# Patient Record
Sex: Male | Born: 1985 | Race: Black or African American | Hispanic: No | Smoking: Current every day smoker
Health system: Southern US, Community
[De-identification: ages and names within clinical notes are randomized; demographics above are authoritative.]

## PROBLEM LIST (undated history)

## (undated) DIAGNOSIS — F419 Anxiety disorder, unspecified: Secondary | ICD-10-CM

## (undated) DIAGNOSIS — F32A Depression, unspecified: Secondary | ICD-10-CM

## (undated) HISTORY — PX: FEMUR FRACTURE SURGERY: SHX633

---

## 2019-12-30 ENCOUNTER — Encounter (HOSPITAL_COMMUNITY): Payer: Self-pay | Admitting: Emergency Medicine

## 2019-12-30 ENCOUNTER — Ambulatory Visit (HOSPITAL_COMMUNITY)
Admission: EM | Admit: 2019-12-30 | Discharge: 2019-12-30 | Disposition: A | Discharge status: Home / Self Care | Payer: No Typology Code available for payment source | Attending: Family Medicine | Admitting: Family Medicine

## 2019-12-30 ENCOUNTER — Other Ambulatory Visit: Payer: Self-pay

## 2019-12-30 DIAGNOSIS — R369 Urethral discharge, unspecified: Secondary | ICD-10-CM | POA: Insufficient documentation

## 2019-12-30 DIAGNOSIS — Z113 Encounter for screening for infections with a predominantly sexual mode of transmission: Secondary | ICD-10-CM | POA: Diagnosis present

## 2019-12-30 MED ORDER — CEFTRIAXONE SODIUM 500 MG IJ SOLR
INTRAMUSCULAR | Status: AC
  Filled 2019-12-30: qty 500

## 2019-12-30 MED ORDER — CEFTRIAXONE SODIUM 500 MG IJ SOLR
500.0000 mg | Freq: Once | INTRAMUSCULAR | Status: AC
  Administered 2019-12-30: 500 mg via INTRAMUSCULAR

## 2019-12-30 MED ORDER — DOXYCYCLINE HYCLATE 100 MG PO CAPS: 100.0000 mg | ORAL_CAPSULE | Freq: Two times a day (BID) | ORAL | 0 refills | Status: AC

## 2019-12-30 NOTE — ED Triage Notes (Signed)
Pt here for STD screening; pt sts penile discharge

## 2019-12-30 NOTE — ED Provider Notes (Signed)
[place]    CSN: [id number] Arrival date & time: 12/30/19  3748      History   Chief Complaint Chief Complaint  Patient presents with  . Exposure to STD    HPI Alexander Rose is a 34 y.o. male.   HPI  Patient presents for treatment of STD. Patient engaged in unprotected sex with a new partner approximately 1 week ago. Days following this encounter, patient developed dysuria, penile discharge-draining white and yellow discharge. He contacted his last sexual partner and she is without symptoms. He denies abdominal pain, nausea, vomiting, back pain,or changes in urine odor. He is in a relationship with his pregnant girlfriend, however,endorses no sexual contact with her since prior sexual partner.  History reviewed. No pertinent past medical history.  There are no problems to display for this patient.  History reviewed. No pertinent surgical history.  Home Medications    Prior to Admission medications   Not on File    Family History Family History  Problem Relation Age of Onset  . Healthy Mother   . Healthy Father     Social History Social History   Tobacco Use  . Smoking status: Never Smoker  . Smokeless tobacco: Never Used  Substance Use Topics  . Alcohol use: Never  . Drug use: Never     Allergies   Patient has no known allergies.   Review of Systems Review of Systems Pertinent negatives listed in HPI  Physical Exam Triage Vital Signs ED Triage Vitals [12/30/19 1031]  Enc Vitals Group     BP (!) 146/94     Pulse Rate 71     Resp 18     Temp 98 F (36.7 C)     Temp Source Oral     SpO2 96 %     Weight      Height      Head Circumference      Peak Flow      Pain Score 0     Pain Loc      Pain Edu?      Excl. in [place]?    No data found.  Updated Vital Signs BP (!) 146/94 (BP Location: Right Arm)   Pulse 71   Temp 98 F (36.7 C) (Oral)   Resp 18   SpO2 96%   Visual Acuity Right Eye Distance:   Left Eye Distance:     Bilateral Distance:    Right Eye Near:   Left Eye Near:    Bilateral Near:     Physical Exam General appearance: alert, well developed, well nourished, cooperative and in no distress Head: Normocephalic, without obvious abnormality, atraumatic Respiratory: Respirations even and unlabored, normal respiratory rate Heart: rate and rhythm normal.  Extremities: No gross deformities Skin: Skin color, texture, turgor normal. No rashes seen  Psych: Appropriate mood and affect. Neurologic: Alert, oriented to person, place, and time, thought content appropriate. Genitourinary: Patient self collected urethral specimen  UC Treatments / Results  Labs (all labs ordered are listed, but only abnormal results are displayed) Labs Reviewed - No data to display  EKG   Radiology No results found.  Procedures Procedures (including critical care time)  Medications Ordered in UC Medications - No data to display  Initial Impression / Assessment and Plan / UC Course  I have reviewed the triage vital signs and the nursing notes.  Pertinent labs & imaging results that were available during my care of the patient were reviewed by me and  considered in my medical decision making (see chart for details).   Encounter for STD screening and treatment following a recent exposure by partner that is positive for chlamydia. Treating for both Gonorrhea and Chlamydia, Rocephin 500 mg IM and Doxycyline 100 mg BID x 7 days prescribed. Encouraged to advise any other recent sexual partners to be tested. Avoid sexual contact for a minimal of  Seven days following treatment and or until penile discharge resolves. Patient declined HIV and RPR testing today. Final Clinical Impressions(s) / UC Diagnoses   Final diagnoses:  Penile discharge  Screen for STD (sexually transmitted disease)     Discharge Instructions     Start Doxycyline 100 mg twice daily for 7 days. Complete entire course to clear infection. If  symptoms worsen or do not completely resolve, return for follow-up here at Urgent Care.     ED Prescriptions    Medication Sig Dispense Auth. Provider   doxycycline (VIBRAMYCIN) 100 MG capsule Take 1 capsule (100 mg total) by mouth 2 (two) times daily for 7 days. 14 capsule Bing Neighbors, FNP     PDMP not reviewed this encounter.   Bing Neighbors, FNP 01/01/20 [phone]

## 2019-12-30 NOTE — Discharge Instructions (Signed)
Start Doxycyline 100 mg twice daily for 7 days. Complete entire course to clear infection. If symptoms worsen or do not completely resolve, return for follow-up here at Urgent Care.

## 2020-01-01 ENCOUNTER — Telehealth (HOSPITAL_COMMUNITY): Payer: Self-pay | Admitting: Emergency Medicine

## 2020-01-01 LAB — CYTOLOGY, (ORAL, ANAL, URETHRAL) ANCILLARY ONLY
Chlamydia: NEGATIVE
Neisseria Gonorrhea: POSITIVE — AB

## 2020-01-01 NOTE — Telephone Encounter (Signed)
Test for gonorrhea was positive. This was treated at the urgent care visit with IM rocephin 500mg. Please refrain from sexual intercourse for 7 days after treatment to give the medicine time to work. Sexual partners need to be notified and tested/treated. Condoms may reduce risk of reinfection. Recheck or followup with PCP for further evaluation if symptoms are not improving. [facility] notified.   Attempted to reach patient. No answer at this time. Voicemail left.     

## 2020-01-05 ENCOUNTER — Telehealth (HOSPITAL_COMMUNITY): Payer: Self-pay | Admitting: Emergency Medicine

## 2020-01-05 NOTE — Telephone Encounter (Signed)
Patient contacted by phone and made aware of  cytology  results. Pt verbalized understanding and had all questions answered.    

## 2020-04-01 DIAGNOSIS — F32A Anxiety disorder, unspecified: Secondary | ICD-10-CM | POA: Insufficient documentation

## 2020-06-06 ENCOUNTER — Ambulatory Visit (HOSPITAL_COMMUNITY)
Admission: EM | Admit: 2020-06-06 | Discharge: 2020-06-06 | Disposition: A | Discharge status: Home / Self Care | Payer: No Typology Code available for payment source | Attending: Urgent Care | Admitting: Urgent Care

## 2020-06-06 ENCOUNTER — Other Ambulatory Visit: Payer: Self-pay

## 2020-06-06 ENCOUNTER — Encounter (HOSPITAL_COMMUNITY): Payer: Self-pay | Admitting: Emergency Medicine

## 2020-06-06 DIAGNOSIS — Z566 Other physical and mental strain related to work: Secondary | ICD-10-CM

## 2020-06-06 DIAGNOSIS — R0789 Other chest pain: Secondary | ICD-10-CM | POA: Diagnosis not present

## 2020-06-06 DIAGNOSIS — F419 Anxiety disorder, unspecified: Secondary | ICD-10-CM

## 2020-06-06 DIAGNOSIS — F4310 Post-traumatic stress disorder, unspecified: Secondary | ICD-10-CM

## 2020-06-06 HISTORY — DX: Anxiety disorder, unspecified: F41.9

## 2020-06-06 HISTORY — DX: Depression, unspecified: F32.A

## 2020-06-06 MED ORDER — SERTRALINE HCL 50 MG PO TABS: 50.0000 mg | ORAL_TABLET | Freq: Every day | ORAL | 0 refills | Status: DC

## 2020-06-06 NOTE — ED Triage Notes (Signed)
Pt here for increased anxiety with fullness in chest x 3 days; pt sts increased stress and hx of similar in past with increased stress; pt denies SI/HI; pt sts increased stress with his job
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
## 2020-06-24 ENCOUNTER — Other Ambulatory Visit: Payer: Self-pay

## 2020-06-24 ENCOUNTER — Encounter (HOSPITAL_COMMUNITY): Payer: Self-pay

## 2020-06-24 ENCOUNTER — Ambulatory Visit (HOSPITAL_COMMUNITY)
Admission: EM | Admit: 2020-06-24 | Discharge: 2020-06-24 | Disposition: A | Discharge status: Home / Self Care | Payer: No Typology Code available for payment source | Attending: Family Medicine | Admitting: Family Medicine

## 2020-06-24 DIAGNOSIS — Z0289 Encounter for other administrative examinations: Secondary | ICD-10-CM

## 2020-06-24 NOTE — ED Provider Notes (Signed)
[facility]    CSN: [id number] Arrival date & time: 06/24/20  0809      History   Chief Complaint Chief Complaint  Patient presents with  . work note    HPI Alexander Rose is a 34 y.o. male.   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  He was cooking pork.  He states there was wrapped work on his hands and the smell of pork as it was cooking. Patient states that he has been Muslim for 17 years and has not eaten pork for that period of time He does not recall any pork allergy prior to becoming Muslim He is not allergic to any other meats.  Is not allergic to cats When he has been seen in the office before he has stated no allergies He does indicate that it is a Muslim he objects to pork as a "forbidden food" he has spoken to his workplace that he wishes to avoid pork as a religious preference.  They still place him in areas where he is exposed to pork.  He wishes a note from the doctor stating he is allergic to pork so he will not have to work with it anymore.  I explained to him that vomiting when he smells cooking or handling it with his hands is not enough of any exposure to cause an allergic reaction, in my experience it would need to be ingested.  In any event I think this is his aversion to pork and not a true allergic reaction.  He states that he had a vomiting reaction in April and went to the emergency room.  He states he went to [facility] emergency room.  I cannot find this record.  Past Medical History:  Diagnosis Date  . Anxiety   . Depression     There are no problems to display for this patient.   History reviewed. No pertinent surgical history.     Home Medications    Prior to Admission medications   Medication Sig Start Date End Date Taking?  Authorizing Provider  sertraline (ZOLOFT) 50 MG tablet Take 1 tablet (50 mg total) by mouth daily. 06/06/20  Yes Wallis Bamberg, PA-C    Family History Family History  Problem Relation Age of Onset  . Healthy Mother   . Healthy Father     Social History Social History   Tobacco Use  . Smoking status: Never Smoker  . Smokeless tobacco: Never Used  Substance Use Topics  . Alcohol use: Never  . Drug use: Never     Allergies   Patient has no known allergies.   Review of Systems Review of Systems See HPI Physical Exam Triage Vital Signs ED Triage Vitals [06/24/20 0843]  Enc Vitals Group     BP 122/68     Pulse Rate 88     Resp 16     Temp 98.2 F (36.8 C)     Temp Source Oral     SpO2      Weight      Height      Head Circumference      Peak Flow      Pain Score 0     Pain Loc      Pain Edu?  Excl. in [place]?    No data found.  Updated Vital Signs BP 122/68 (BP Location: Right Arm)   Pulse 88   Temp 98.2 F (36.8 C) (Oral)   Resp 16  :     Physical Exam Constitutional:      General: He is not in acute distress.    Appearance: He is well-developed.     Comments: Healthy appearance  HENT:     Head: Normocephalic and atraumatic.     Mouth/Throat:     Comments: Mask in place Eyes:     Conjunctiva/sclera: Conjunctivae normal.     Pupils: Pupils are equal, round, and reactive to light.  Cardiovascular:     Rate and Rhythm: Normal rate.  Pulmonary:     Effort: Pulmonary effort is normal. No respiratory distress.  Abdominal:     General: There is no distension.     Palpations: Abdomen is soft.  Musculoskeletal:        General: Normal range of motion.     Cervical back: Normal range of motion.  Skin:    General: Skin is warm and dry.  Neurological:     Mental Status: He is alert.  Psychiatric:        Mood and Affect: Mood normal.        Behavior: Behavior normal.     Comments: Pleasant, pressured      UC Treatments / Results  Labs (all  labs ordered are listed, but only abnormal results are displayed) Labs Reviewed - No data to display  EKG   Radiology No results found.  Procedures Procedures (including critical care time)  Medications Ordered in UC Medications - No data to display  Initial Impression / Assessment and Plan / UC Course  I have reviewed the triage vital signs and the nursing notes.  Pertinent labs & imaging results that were available during my care of the patient were reviewed by me and considered in my medical decision making (see chart for details).     I reviewed the patient that I do not have any evidence that he is allergic.  I cannot write a note for his employer I recommended that he try to reason with his employer regarding his religious preference I did offer allergy testing if he wishes to pursue this avenue Final Clinical Impressions(s) / UC Diagnoses   Final diagnoses:  Encounter to obtain excuse from work     Discharge Instructions     We are unable to excuse from work I can refer you for allergy evaluation   ED Prescriptions    None     PDMP not reviewed this encounter.   Eustace Moore, MD 06/24/20 1039

## 2020-06-24 NOTE — ED Triage Notes (Signed)
Pt presents to UC requesting documentation of pork allergy to provide to work place. No acute complaints at this time.

## 2020-06-24 NOTE — Discharge Instructions (Signed)
We are unable to excuse from work I can refer you for allergy evaluation

## 2020-07-03 ENCOUNTER — Emergency Department (HOSPITAL_COMMUNITY)
Admission: EM | Admit: 2020-07-03 | Discharge: 2020-07-03 | Discharge status: Against Medical Advice | Payer: No Typology Code available for payment source | Attending: Emergency Medicine | Admitting: Emergency Medicine

## 2020-07-03 ENCOUNTER — Emergency Department (HOSPITAL_COMMUNITY): Payer: No Typology Code available for payment source

## 2020-07-03 ENCOUNTER — Other Ambulatory Visit: Payer: Self-pay

## 2020-07-03 DIAGNOSIS — S50312A Abrasion of left elbow, initial encounter: Secondary | ICD-10-CM | POA: Insufficient documentation

## 2020-07-03 DIAGNOSIS — Y929 Unspecified place or not applicable: Secondary | ICD-10-CM | POA: Diagnosis not present

## 2020-07-03 DIAGNOSIS — R0789 Other chest pain: Secondary | ICD-10-CM | POA: Insufficient documentation

## 2020-07-03 DIAGNOSIS — S50311A Abrasion of right elbow, initial encounter: Secondary | ICD-10-CM | POA: Diagnosis not present

## 2020-07-03 DIAGNOSIS — Y999 Unspecified external cause status: Secondary | ICD-10-CM | POA: Diagnosis not present

## 2020-07-03 DIAGNOSIS — Y939 Activity, unspecified: Secondary | ICD-10-CM | POA: Diagnosis not present

## 2020-07-03 DIAGNOSIS — S80211A Abrasion, right knee, initial encounter: Secondary | ICD-10-CM | POA: Diagnosis not present

## 2020-07-03 DIAGNOSIS — S8991XA Unspecified injury of right lower leg, initial encounter: Secondary | ICD-10-CM | POA: Diagnosis present

## 2020-07-03 DIAGNOSIS — R079 Chest pain, unspecified: Secondary | ICD-10-CM

## 2020-07-03 DIAGNOSIS — M25532 Pain in left wrist: Secondary | ICD-10-CM | POA: Diagnosis not present

## 2020-07-03 DIAGNOSIS — T148XXA Other injury of unspecified body region, initial encounter: Secondary | ICD-10-CM

## 2020-07-03 DIAGNOSIS — M25531 Pain in right wrist: Secondary | ICD-10-CM | POA: Diagnosis not present

## 2020-07-03 DIAGNOSIS — S80212A Abrasion, left knee, initial encounter: Secondary | ICD-10-CM | POA: Diagnosis not present

## 2020-07-03 NOTE — ED Notes (Signed)
Pt returned to room at this time after changing his mind and wanting to be seen again at this time. Apolinar Junes PA at bedside.
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
## 2020-07-03 NOTE — ED Notes (Signed)
PT speaking to [place] PA at this time in hall stating he wants to leave. PT ambulatory out of dept without signing AMA form

## 2020-07-03 NOTE — ED Notes (Signed)
Pt refused EKG.

## 2020-12-01 ENCOUNTER — Encounter (HOSPITAL_COMMUNITY): Payer: Self-pay | Admitting: Emergency Medicine

## 2020-12-01 ENCOUNTER — Emergency Department (HOSPITAL_COMMUNITY)
Admission: EM | Admit: 2020-12-01 | Discharge: 2020-12-02 | Disposition: A | Discharge status: Home / Self Care | Payer: Worker's Compensation | Attending: Emergency Medicine | Admitting: Emergency Medicine

## 2020-12-01 ENCOUNTER — Other Ambulatory Visit: Payer: Self-pay

## 2020-12-01 DIAGNOSIS — T22032A Burn of unspecified degree of left upper arm, initial encounter: Secondary | ICD-10-CM | POA: Insufficient documentation

## 2020-12-01 DIAGNOSIS — T24012A Burn of unspecified degree of left thigh, initial encounter: Secondary | ICD-10-CM | POA: Diagnosis not present

## 2020-12-01 DIAGNOSIS — X102XXA Contact with fats and cooking oils, initial encounter: Secondary | ICD-10-CM | POA: Insufficient documentation

## 2020-12-01 DIAGNOSIS — T24011A Burn of unspecified degree of right thigh, initial encounter: Secondary | ICD-10-CM | POA: Insufficient documentation

## 2020-12-01 DIAGNOSIS — Y9289 Other specified places as the place of occurrence of the external cause: Secondary | ICD-10-CM | POA: Diagnosis not present

## 2020-12-01 DIAGNOSIS — Z5321 Procedure and treatment not carried out due to patient leaving prior to being seen by health care provider: Secondary | ICD-10-CM | POA: Insufficient documentation
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
## 2020-12-02 ENCOUNTER — Ambulatory Visit (HOSPITAL_COMMUNITY)
Admission: EM | Admit: 2020-12-02 | Discharge: 2020-12-02 | Disposition: A | Discharge status: Home / Self Care | Payer: PRIVATE HEALTH INSURANCE | Attending: Family Medicine | Admitting: Family Medicine

## 2020-12-02 ENCOUNTER — Encounter (HOSPITAL_COMMUNITY): Payer: Self-pay

## 2020-12-02 DIAGNOSIS — T300 Burn of unspecified body region, unspecified degree: Secondary | ICD-10-CM

## 2020-12-02 MED ORDER — TRAMADOL HCL 50 MG PO TABS: 50.0000 mg | ORAL_TABLET | Freq: Four times a day (QID) | ORAL | 0 refills | Status: DC | PRN

## 2020-12-02 MED ORDER — ERYTHROMYCIN 5 MG/GM OP OINT: TOPICAL_OINTMENT | OPHTHALMIC | 0 refills | Status: DC

## 2020-12-02 MED ORDER — SILVER SULFADIAZINE 1 % EX CREA: 1.0000 "application " | TOPICAL_CREAM | Freq: Two times a day (BID) | CUTANEOUS | 1 refills | Status: DC

## 2020-12-02 NOTE — ED Notes (Signed)
Pt states that he does not want to wait due to having kids at home. Pt seen walking out ED entrance.
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
## 2020-12-06 ENCOUNTER — Ambulatory Visit (HOSPITAL_COMMUNITY)
Admission: EM | Admit: 2020-12-06 | Discharge: 2020-12-06 | Disposition: A | Discharge status: Home / Self Care | Payer: PRIVATE HEALTH INSURANCE | Attending: Family Medicine | Admitting: Family Medicine

## 2020-12-06 ENCOUNTER — Other Ambulatory Visit: Payer: Self-pay

## 2020-12-06 ENCOUNTER — Encounter (HOSPITAL_COMMUNITY): Payer: Self-pay | Admitting: Emergency Medicine

## 2020-12-06 DIAGNOSIS — R030 Elevated blood-pressure reading, without diagnosis of hypertension: Secondary | ICD-10-CM

## 2020-12-06 DIAGNOSIS — T300 Burn of unspecified body region, unspecified degree: Secondary | ICD-10-CM

## 2020-12-06 MED ORDER — TRAMADOL HCL 50 MG PO TABS: 50.0000 mg | ORAL_TABLET | Freq: Four times a day (QID) | ORAL | 0 refills | Status: DC | PRN

## 2020-12-06 MED ORDER — ERYTHROMYCIN 5 MG/GM OP OINT
TOPICAL_OINTMENT | OPHTHALMIC | Status: AC
  Filled 2020-12-06: qty 3.5

## 2020-12-06 MED ORDER — IBUPROFEN 800 MG PO TABS: 800.0000 mg | ORAL_TABLET | Freq: Three times a day (TID) | ORAL | 0 refills | Status: DC | PRN

## 2020-12-06 MED ORDER — ERYTHROMYCIN 5 MG/GM OP OINT: TOPICAL_OINTMENT | Freq: Once | OPHTHALMIC | Status: DC

## 2020-12-06 NOTE — Discharge Instructions (Signed)
Wash daily and apply burn cream Take ibuprofen for moderate pain Take tramadol for more severe pain Avoid heat Watch for infection Follow up with workers comp clinic See a PCP for blood pressure
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
## 2020-12-09 ENCOUNTER — Encounter (HOSPITAL_COMMUNITY): Payer: Self-pay

## 2020-12-09 ENCOUNTER — Other Ambulatory Visit: Payer: Self-pay

## 2020-12-09 ENCOUNTER — Ambulatory Visit (HOSPITAL_COMMUNITY)
Admission: EM | Admit: 2020-12-09 | Discharge: 2020-12-09 | Disposition: A | Discharge status: Home / Self Care | Payer: PRIVATE HEALTH INSURANCE

## 2020-12-09 DIAGNOSIS — Z09 Encounter for follow-up examination after completed treatment for conditions other than malignant neoplasm: Secondary | ICD-10-CM | POA: Diagnosis not present

## 2020-12-09 NOTE — Discharge Instructions (Addendum)
Pt is to follow up with work comp for specialist or further care  Cont to use saline drops as needed for eyes  We will not be able to give any further days off work
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
## 2020-12-16 ENCOUNTER — Telehealth (HOSPITAL_COMMUNITY): Payer: Self-pay | Admitting: Family Medicine
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
## 2021-02-25 ENCOUNTER — Ambulatory Visit (HOSPITAL_COMMUNITY)
Admission: EM | Admit: 2021-02-25 | Discharge: 2021-02-25 | Disposition: A | Discharge status: Home / Self Care | Payer: PRIVATE HEALTH INSURANCE | Attending: Emergency Medicine | Admitting: Emergency Medicine

## 2021-02-25 ENCOUNTER — Other Ambulatory Visit: Payer: Self-pay

## 2021-02-25 DIAGNOSIS — M25512 Pain in left shoulder: Secondary | ICD-10-CM

## 2021-02-25 MED ORDER — NAPROXEN 500 MG PO TABS: 500.0000 mg | ORAL_TABLET | Freq: Two times a day (BID) | ORAL | 0 refills | Status: DC
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
## 2021-02-25 NOTE — ED Provider Notes (Signed)
[facility]    CSN: [id number] Arrival date & time: 02/25/21  1450      History   Chief Complaint Chief Complaint  Patient presents with  . Shoulder Pain    HPI Alexander Rose is a 35 y.o. male.   Alexander Rose presents with complaints of left shoulder pain which started two days ago. No specific known injury but he had been lifting at work prior to onset of pain. Pain with raising his left arm such as to drive and to take his shirt off. Today pain and ROM is slightly improved from yesterday. Hasn't taken any medications for symptoms. No radiation of pain. No numbness tingling or weakness. 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 No previous shoulder injury.     ROS per HPI, negative if not otherwise mentioned.      Past Medical History:  Diagnosis Date  . Anxiety   . Depression     There are no problems to display for this patient.   No past surgical history on file.     Home Medications    Prior to Admission medications   Medication Sig Start Date End Date Taking? Authorizing Provider  naproxen (NAPROSYN) 500 MG tablet Take 1 tablet (500 mg total) by mouth 2 (two) times daily. 02/25/21  Yes Linus Mako B, NP  erythromycin ophthalmic ointment Place a 1/2 inch ribbon of ointment into the lower eyelid. 12/02/20   Particia Nearing, PA-C  ibuprofen (ADVIL) 800 MG tablet Take 1 tablet (800 mg total) by mouth every 8 (eight) hours as needed. 12/06/20   Eustace Moore, MD  sertraline (ZOLOFT) 50 MG tablet Take 1 tablet (50 mg total) by mouth daily. 06/06/20   Wallis Bamberg, PA-C  silver sulfADIAZINE (SILVADENE) 1 % cream Apply 1 application topically 2 (two) times daily. 12/02/20   Particia Nearing, PA-C  traMADol (ULTRAM) 50 MG tablet Take 1 tablet (50 mg total) by mouth every 6 (six) hours as needed. 12/06/20   Eustace Moore, MD    Family History Family History  Problem Relation Age of Onset  .  Healthy Mother   . Healthy Father     Social History Social History   Tobacco Use  . Smoking status: Never Smoker  . Smokeless tobacco: Never Used  Substance Use Topics  . Alcohol use: Never  . Drug use: Never     Allergies   Patient has no known allergies.   Review of Systems Review of Systems   Physical Exam Triage Vital Signs ED Triage Vitals  Enc Vitals Group     BP 02/25/21 1547 (!) 147/92     Pulse Rate 02/25/21 1547 87     Resp 02/25/21 1547 18     Temp 02/25/21 1547 98.8 F (37.1 C)     Temp Source 02/25/21 1547 Oral     SpO2 02/25/21 1547 97 %     Weight --      Height --      Head Circumference --      Peak Flow --      Pain Score 02/25/21 1544 8     Pain Loc --      Pain Edu? --      Excl. in GC? --    No data found.  Updated Vital Signs BP (!) 147/92 (BP Location: Right Arm)   Pulse 87   Temp 98.8 F (37.1 C) (Oral)  Resp 18   SpO2 97%   Visual Acuity Right Eye Distance:   Left Eye Distance:   Bilateral Distance:    Right Eye Near:   Left Eye Near:    Bilateral Near:     Physical Exam Constitutional:      Appearance: He is well-developed.  Cardiovascular:     Rate and Rhythm: Normal rate.  Pulmonary:     Effort: Pulmonary effort is normal.  Musculoskeletal:     Left shoulder: Swelling and tenderness present. No bony tenderness. Decreased range of motion. Normal strength. Normal pulse.     Comments: Left shoulder soft tissue tenderness, supraspinatus (?), to anterior and posterior shoulder with mild swelling noted to left anterior shoulder; pain with overhead ROM as well as external rotation; strength equal bilaterally; gross sensation intact to upper extremities   Skin:    General: Skin is warm and dry.  Neurological:     Mental Status: He is alert and oriented to person, place, and time.      UC Treatments / Results  Labs (all labs ordered are listed, but only abnormal results are displayed) Labs Reviewed - No data to  display  EKG   Radiology No results found.  Procedures Procedures (including critical care time)  Medications Ordered in UC Medications - No data to display  Initial Impression / Assessment and Plan / UC Course  I have reviewed the triage vital signs and the nursing notes.  Pertinent labs & imaging results that were available during my care of the patient were reviewed by me and considered in my medical decision making (see chart for details).     Left shoulder strain, supraspinatus injury? Vs ligamentous injury. nsaids recommended, follow up with ortho and/or sports medicine. Patient verbalized understanding and agreeable to plan.   Final Clinical Impressions(s) / UC Diagnoses   Final diagnoses:  Acute pain of left shoulder     Discharge Instructions     Apply ice at the end of the day after use.  Light and regular activity as tolerated, see provided exercises.  Naproxen twice a day, take with food, to help with pain.  Follow up with sports medicine and/or orthopedics for further evaluation and management.    ED Prescriptions    Medication Sig Dispense Auth. Provider   naproxen (NAPROSYN) 500 MG tablet Take 1 tablet (500 mg total) by mouth 2 (two) times daily. 30 tablet Georgetta Haber, NP     PDMP not reviewed this encounter.   Georgetta Haber, NP 02/25/21 1626

## 2021-02-25 NOTE — Discharge Instructions (Signed)
Apply ice at the end of the day after use.  Light and regular activity as tolerated, see provided exercises.  Naproxen twice a day, take with food, to help with pain.  Follow up with sports medicine and/or orthopedics for further evaluation and management.

## 2021-02-27 ENCOUNTER — Other Ambulatory Visit: Payer: Self-pay

## 2021-02-27 ENCOUNTER — Emergency Department (HOSPITAL_COMMUNITY): Payer: No Typology Code available for payment source

## 2021-02-27 ENCOUNTER — Emergency Department (HOSPITAL_COMMUNITY)
Admission: EM | Admit: 2021-02-27 | Discharge: 2021-02-27 | Disposition: A | Discharge status: Home / Self Care | Payer: No Typology Code available for payment source | Attending: Emergency Medicine | Admitting: Emergency Medicine

## 2021-02-27 DIAGNOSIS — Y92410 Unspecified street and highway as the place of occurrence of the external cause: Secondary | ICD-10-CM | POA: Diagnosis not present

## 2021-02-27 DIAGNOSIS — S40012A Contusion of left shoulder, initial encounter: Secondary | ICD-10-CM | POA: Insufficient documentation

## 2021-02-27 DIAGNOSIS — S4992XA Unspecified injury of left shoulder and upper arm, initial encounter: Secondary | ICD-10-CM | POA: Diagnosis present
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
## 2021-02-27 NOTE — Discharge Instructions (Signed)
Return if any problems.  Schedule to see the Orthopaedist for evaluation °
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
## 2021-05-23 ENCOUNTER — Ambulatory Visit (HOSPITAL_COMMUNITY)
Admission: EM | Admit: 2021-05-23 | Discharge: 2021-05-23 | Disposition: A | Discharge status: Home / Self Care | Payer: PRIVATE HEALTH INSURANCE | Attending: Emergency Medicine | Admitting: Emergency Medicine

## 2021-05-23 ENCOUNTER — Encounter (HOSPITAL_COMMUNITY): Payer: Self-pay

## 2021-05-23 ENCOUNTER — Other Ambulatory Visit: Payer: Self-pay

## 2021-05-23 DIAGNOSIS — Z202 Contact with and (suspected) exposure to infections with a predominantly sexual mode of transmission: Secondary | ICD-10-CM | POA: Diagnosis not present

## 2021-05-23 DIAGNOSIS — A599 Trichomoniasis, unspecified: Secondary | ICD-10-CM | POA: Diagnosis not present

## 2021-05-23 MED ORDER — METRONIDAZOLE 500 MG PO TABS: 2000.0000 mg | ORAL_TABLET | Freq: Once | ORAL | 0 refills | Status: AC

## 2021-05-23 NOTE — ED Provider Notes (Signed)
[facility]  ____________________________________________  Time seen: Approximately 1:08 PM  I have reviewed the triage vital signs and the nursing notes.   HISTORY  Chief Complaint Exposure to STD    HPI Alexander Rose is a 35 y.o. male who presents the urgent care concerned that he has been exposed to trichomoniasis.  Patient's wife was diagnosed with trichomoniasis and he wanted testing.  Patient has no abdominal pain, dysuria, polyuria, hematuria.  No genital lesions.  No testicular pain.  Patient is asymptomatic at this time.       Past Medical History:  Diagnosis Date   Anxiety    Depression     There are no problems to display for this patient.   History reviewed. No pertinent surgical history.  Prior to Admission medications   Medication Sig Start Date End Date Taking? Authorizing Provider  metroNIDAZOLE (FLAGYL) 500 MG tablet Take 4 tablets (2,000 mg total) by mouth once for 1 dose. 05/23/21 05/23/21 Yes Orlandus Borowski, Delorise Royals, PA-C  naproxen (NAPROSYN) 500 MG tablet Take 1 tablet (500 mg total) by mouth 2 (two) times daily. 02/25/21   Georgetta Haber, NP  sertraline (ZOLOFT) 50 MG tablet Take 1 tablet (50 mg total) by mouth daily. 06/06/20 02/27/21  Wallis Bamberg, PA-C    Allergies Patient has no known allergies.  Family History  Problem Relation Age of Onset   Healthy Mother    Healthy Father     Social History Social History   Tobacco Use   Smoking status: Every Day    Packs/day: 1.00    Years: 12.00    Pack years: 12.00    Types: Cigarettes   Smokeless tobacco: Never  Substance Use Topics   Alcohol use: Never   Drug use: Never     Review of Systems  Constitutional: No fever/chills Eyes: No visual changes. No discharge ENT: No upper respiratory complaints. Cardiovascular: no chest pain. Respiratory: no cough. No SOB. Gastrointestinal: No abdominal pain.  No nausea, no vomiting.  No diarrhea.  No constipation. Genitourinary:  Negative for dysuria. No hematuria.  Positive for exposure to trichomoniasis Musculoskeletal: Negative for musculoskeletal pain. Skin: Negative for rash, abrasions, lacerations, ecchymosis. Neurological: Negative for headaches, focal weakness or numbness.  10 System ROS otherwise negative.  ____________________________________________   PHYSICAL EXAM:  VITAL SIGNS: ED Triage Vitals  Enc Vitals Group     BP 05/23/21 1219 (!) 155/90     Pulse Rate 05/23/21 1219 65     Resp 05/23/21 1219 18     Temp 05/23/21 1219 98.2 F (36.8 C)     Temp Source 05/23/21 1219 Oral     SpO2 05/23/21 1219 96 %     Weight --      Height --      Head Circumference --      Peak Flow --      Pain Score 05/23/21 1216 0     Pain Loc --      Pain Edu? --      Excl. in GC? --      Constitutional: Alert and oriented. Well appearing and in no acute distress. Eyes: Conjunctivae are normal. PERRL. EOMI. Head: Atraumatic. ENT:      Ears:       Nose: No congestion/rhinnorhea.      Mouth/Throat: Mucous membranes are moist.  Neck: No stridor.    Cardiovascular: Normal rate, regular rhythm. Normal S1 and S2.  Good peripheral circulation. Respiratory: Normal respiratory effort without tachypnea or  retractions. Lungs CTAB. Good air entry to the bases with no decreased or absent breath sounds. Gastrointestinal: Bowel sounds 4 quadrants. Soft and nontender to palpation. No guarding or rigidity. No palpable masses. No distention. No CVA tenderness. Genitourinary: Patient defers exam at this time Musculoskeletal: Full range of motion to all extremities. No gross deformities appreciated. Neurologic:  Normal speech and language. No gross focal neurologic deficits are appreciated.  Skin:  Skin is warm, dry and intact. No rash noted. Psychiatric: Mood and affect are normal. Speech and behavior are normal. Patient exhibits appropriate insight and judgement.   ____________________________________________    LABS (all labs ordered are listed, but only abnormal results are displayed)  Labs Reviewed - No data to display ____________________________________________  EKG   ____________________________________________  RADIOLOGY   No results found.  ____________________________________________    PROCEDURES  Procedure(s) performed:    Procedures    Medications - No data to display   ____________________________________________   INITIAL IMPRESSION / ASSESSMENT AND PLAN / ED COURSE  Pertinent labs & imaging results that were available during my care of the patient were reviewed by me and considered in my medical decision making (see chart for details).  Review of the [place] CSRS was performed in accordance of the [facility] prior to dispensing any controlled drugs.           Patient's diagnosis is consistent with exposure to trichomoniasis.  Patient presented to the urgent care after being exposed to trichomoniasis.  Patient has a positive/known exposure but is asymptomatic at this time.  Discussed there is no approved testing for males for trichomoniasis but that we treat empirically.  Discussed symptoms of other STDs and he states that he has no concern for gonorrhea or chlamydia.  He does not wish to pursue testing at this time which I feel is reasonable.  Patient develops symptoms he will return for evaluation.  At this time we will treat empirically with metronidazole for trichomoniasis..  Follow-up primary care as needed.. Patient is given ED precautions to return to the ED for any worsening or new symptoms.     ____________________________________________  FINAL CLINICAL IMPRESSION(S) / DIAGNOSES  Final diagnoses:  STD exposure  Trichomoniasis      NEW MEDICATIONS STARTED DURING THIS VISIT:  ED Discharge Orders          Ordered    metroNIDAZOLE (FLAGYL) 500 MG tablet   Once        05/23/21 1312                This chart was dictated using voice  recognition software/Dragon. Despite best efforts to proofread, errors can occur which can change the meaning. Any change was purely unintentional.    Racheal Patches, PA-C 05/23/21 1313
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
## 2021-09-10 DIAGNOSIS — Z419 Encounter for procedure for purposes other than remedying health state, unspecified: Secondary | ICD-10-CM | POA: Diagnosis not present

## 2021-10-11 DIAGNOSIS — Z419 Encounter for procedure for purposes other than remedying health state, unspecified: Secondary | ICD-10-CM | POA: Diagnosis not present

## 2021-11-10 DIAGNOSIS — Z419 Encounter for procedure for purposes other than remedying health state, unspecified: Secondary | ICD-10-CM | POA: Diagnosis not present

## 2021-12-11 DIAGNOSIS — Z419 Encounter for procedure for purposes other than remedying health state, unspecified: Secondary | ICD-10-CM | POA: Diagnosis not present

## 2022-01-11 DIAGNOSIS — Z419 Encounter for procedure for purposes other than remedying health state, unspecified: Secondary | ICD-10-CM | POA: Diagnosis not present

## 2022-02-08 DIAGNOSIS — Z419 Encounter for procedure for purposes other than remedying health state, unspecified: Secondary | ICD-10-CM | POA: Diagnosis not present

## 2022-03-11 DIAGNOSIS — Z419 Encounter for procedure for purposes other than remedying health state, unspecified: Secondary | ICD-10-CM | POA: Diagnosis not present

## 2022-04-10 DIAGNOSIS — Z419 Encounter for procedure for purposes other than remedying health state, unspecified: Secondary | ICD-10-CM | POA: Diagnosis not present

## 2022-04-17 ENCOUNTER — Ambulatory Visit (HOSPITAL_COMMUNITY)
Admission: EM | Admit: 2022-04-17 | Discharge: 2022-04-17 | Disposition: A | Discharge status: Home / Self Care | Payer: Medicaid Other | Attending: Family Medicine | Admitting: Family Medicine

## 2022-04-17 ENCOUNTER — Encounter (HOSPITAL_COMMUNITY): Payer: Self-pay | Admitting: Emergency Medicine

## 2022-04-17 ENCOUNTER — Ambulatory Visit (INDEPENDENT_AMBULATORY_CARE_PROVIDER_SITE_OTHER): Payer: Medicaid Other

## 2022-04-17 DIAGNOSIS — R079 Chest pain, unspecified: Secondary | ICD-10-CM

## 2022-04-17 DIAGNOSIS — J4521 Mild intermittent asthma with (acute) exacerbation: Secondary | ICD-10-CM

## 2022-04-17 DIAGNOSIS — R030 Elevated blood-pressure reading, without diagnosis of hypertension: Secondary | ICD-10-CM

## 2022-04-17 DIAGNOSIS — M940 Chondrocostal junction syndrome [Tietze]: Secondary | ICD-10-CM

## 2022-04-17 MED ORDER — DICLOFENAC SODIUM 75 MG PO TBEC: 75.0000 mg | DELAYED_RELEASE_TABLET | Freq: Two times a day (BID) | ORAL | 0 refills | Status: DC

## 2022-04-17 MED ORDER — PREDNISONE 20 MG PO TABS: 40.0000 mg | ORAL_TABLET | Freq: Every day | ORAL | 0 refills | Status: DC

## 2022-04-17 MED ORDER — ALBUTEROL SULFATE HFA 108 (90 BASE) MCG/ACT IN AERS: 1.0000 | INHALATION_SPRAY | Freq: Four times a day (QID) | RESPIRATORY_TRACT | 1 refills | Status: DC | PRN

## 2022-04-17 NOTE — ED Triage Notes (Signed)
Pt c/o central chest pain for 2 days. Reports at work last night when lifting or palpating chest pain was worse. Pt reports after work went home to lay down and hurt to turn over. Reports pain in chest was worse with trying to pull up pants. ? ?Pt reports that when leaving court house prior to come here felt faint.  ?

## 2022-04-17 NOTE — Discharge Instructions (Addendum)
Your blood pressure was noted to be elevated during your visit today. If you are currently taking medication for high blood pressure, please ensure you are taking this as directed. If you do not have a history of high blood pressure and your blood pressure remains persistently elevated, you may need to begin taking a medication at some point. You may return here within the next few days to recheck if unable to see your primary care provider or if you do not have a one. ? ?BP (!) 150/112 (BP Location: Right Arm)   Pulse 81   Temp 98.4 ?F (36.9 ?C)   Resp 20   SpO2 98%  ? ?BP Readings from Last 3 Encounters:  ?04/17/22 (!) 150/112  ?05/23/21 (!) 155/90  ?02/27/21 (!) 157/88  ? ? ? ?

## 2022-04-19 NOTE — ED Provider Notes (Signed)
?[facility] ? ? ?[id number] ?04/17/22 Arrival Time: 1022 ? ?ASSESSMENT & PLAN: ? ?1. Chest pain, unspecified type   ?2. Costochondritis   ?3. Mild intermittent asthma with acute exacerbation   ?4. Elevated blood pressure reading without diagnosis of hypertension   ? ?Patient history and exam consistent with non-cardiac cause of chest pain. ?Worsening signs and symptoms discussed and patient verbalized understanding. ? ?ECG: ?Performed today and interpreted by me: inferior ST abnormality; no STEMI. ECG done without pain in chest present per patient. ? ?Denies recreational drug use. Is a smoker. ?Given description of pain, I feel this is completely MSK. Pain he describes is easily reproducable upon movements of torso. ? ?I have personally viewed the imaging studies ordered this visit. ?Normal CXR. No pneumothorax. ? ?Desires refill of asthma inhaler; has been wheezing lately. ?Meds ordered this encounter  ?Medications  ? diclofenac (VOLTAREN) 75 MG EC tablet  ?  Sig: Take 1 tablet (75 mg total) by mouth 2 (two) times daily.  ?  Dispense:  14 tablet  ?  Refill:  0  ? predniSONE (DELTASONE) 20 MG tablet  ?  Sig: Take 2 tablets (40 mg total) by mouth daily.  ?  Dispense:  10 tablet  ?  Refill:  0  ? albuterol (VENTOLIN HFA) 108 (90 Base) MCG/ACT inhaler  ?  Sig: Inhale 1-2 puffs into the lungs every 6 (six) hours as needed for wheezing or shortness of breath.  ?  Dispense:  1 each  ?  Refill:  1  ? ? ? ?Discharge Instructions   ? ?  ?Your blood pressure was noted to be elevated during your visit today. If you are currently taking medication for high blood pressure, please ensure you are taking this as directed. If you do not have a history of high blood pressure and your blood pressure remains persistently elevated, you may need to begin taking a medication at some point. You may return here within the next few days to recheck if unable to see your primary care provider or if you do not have a one. ? ?BP (!)  150/112 (BP Location: Right Arm)   Pulse 81   Temp 98.4 ?F (36.9 ?C)   Resp 20   SpO2 98%  ? ?BP Readings from Last 3 Encounters:  ?04/17/22 (!) 150/112  ?05/23/21 (!) 155/90  ?02/27/21 (!) 157/88  ? ? ? ? ? ? ? ?Chest pain precautions given. ?Reviewed expectations re: course of current medical issues. Questions answered. ?Outlined signs and symptoms indicating need for more acute intervention. ?Patient verbalized understanding. ?After Visit Summary given. ? ? ?SUBJECTIVE: ? ?History from: patient. ?Alexander Rose is a 36 y.o. male who presents with complaint of centralized mid/upper chest pain; noted 2 days ago while lifting item at work; no trauma. Desc as dull but with occas sharp pain on certain torso movements. Does not radiate. No assoc SOB/n/v/diaphoresis. Today noted CP worse when trying to bend forward to pull up pants. No pain at rest. ?Denies: fatigue, irregular heart beat, lower extremity edema, near-syncope, orthopnea, palpitations, and syncope. ?Recent illnesses: none. Fever: absent. ?Ambulatory without assistance. ?Self/OTC treatment: none. ?History of similar: no. ?Does feel he has been wheezing lately with seasonal allergies. Needs alb inhaler. ? ?Social History  ? ?Tobacco Use  ?Smoking Status Every Day  ? Packs/day: 1.00  ? Years: 12.00  ? Pack years: 12.00  ? Types: Cigarettes  ?Smokeless Tobacco Never  ? ?Social History  ? ?Substance and Sexual Activity  ?  Alcohol Use Never  ? ?Increased blood pressure noted today. Reports that he has not been treated for hypertension in the past. ? ? ?OBJECTIVE: ? ?Vitals:  ? 04/17/22 1028  ?BP: (!) 150/112  ?Pulse: 81  ?Resp: 20  ?Temp: 98.4 ?F (36.9 ?C)  ?SpO2: 98%  ?  ?General appearance: alert, oriented, no acute distress ?Eyes: PERRLA; EOMI; conjunctivae normal ?HENT: normocephalic; atraumatic ?Neck: supple with FROM ?Lungs: without labored respirations; speaks full sentences without difficulty; CTAB except for an occas mild wheeze ?Heart: regular rate  and rhythm without murmer ?Chest Wall: with tenderness to palpation over sternal borders ?Abdomen: soft, non-tender; no guarding or rebound tenderness ?Extremities: without edema; without calf swelling or tenderness; symmetrical without gross deformities ?Skin: warm and dry; without rash or lesions ?Neuro: normal gait ?Psychological: alert and cooperative; normal mood and affect ? ? ?No Known Allergies ? ?Past Medical History:  ?Diagnosis Date  ? Anxiety   ? Depression   ? ?Social History  ? ?Socioeconomic History  ? Marital status: Significant Other  ?  Spouse name: Not on file  ? Number of children: Not on file  ? Years of education: Not on file  ? Highest education level: Not on file  ?Occupational History  ? Not on file  ?Tobacco Use  ? Smoking status: Every Day  ?  Packs/day: 1.00  ?  Years: 12.00  ?  Pack years: 12.00  ?  Types: Cigarettes  ? Smokeless tobacco: Never  ?Substance and Sexual Activity  ? Alcohol use: Never  ? Drug use: Never  ? Sexual activity: Yes  ?Other Topics Concern  ? Not on file  ?Social History Narrative  ? Not on file  ? ?Social Determinants of Health  ? ?Financial Resource Strain: Not on file  ?Food Insecurity: Not on file  ?Transportation Needs: Not on file  ?Physical Activity: Not on file  ?Stress: Not on file  ?Social Connections: Not on file  ?Intimate Partner Violence: Not on file  ? ?Family History  ?Problem Relation Age of Onset  ? Healthy Mother   ? Healthy Father   ? ?History reviewed. No pertinent surgical history. ? ?  ?Mardella Layman, MD ?04/19/22 [phone] ? ?

## 2022-05-11 DIAGNOSIS — Z419 Encounter for procedure for purposes other than remedying health state, unspecified: Secondary | ICD-10-CM | POA: Diagnosis not present

## 2022-06-10 DIAGNOSIS — Z419 Encounter for procedure for purposes other than remedying health state, unspecified: Secondary | ICD-10-CM | POA: Diagnosis not present

## 2022-07-11 DIAGNOSIS — Z419 Encounter for procedure for purposes other than remedying health state, unspecified: Secondary | ICD-10-CM | POA: Diagnosis not present

## 2022-08-11 DIAGNOSIS — Z419 Encounter for procedure for purposes other than remedying health state, unspecified: Secondary | ICD-10-CM | POA: Diagnosis not present

## 2022-09-10 DIAGNOSIS — Z419 Encounter for procedure for purposes other than remedying health state, unspecified: Secondary | ICD-10-CM | POA: Diagnosis not present

## 2022-10-11 DIAGNOSIS — Z419 Encounter for procedure for purposes other than remedying health state, unspecified: Secondary | ICD-10-CM | POA: Diagnosis not present

## 2022-11-10 DIAGNOSIS — Z419 Encounter for procedure for purposes other than remedying health state, unspecified: Secondary | ICD-10-CM | POA: Diagnosis not present

## 2022-12-15 ENCOUNTER — Ambulatory Visit (HOSPITAL_COMMUNITY)
Admission: EM | Admit: 2022-12-15 | Discharge: 2022-12-15 | Disposition: A | Discharge status: Home / Self Care | Payer: 59 | Attending: Emergency Medicine | Admitting: Emergency Medicine

## 2022-12-15 ENCOUNTER — Encounter (HOSPITAL_COMMUNITY): Payer: Self-pay | Admitting: Emergency Medicine

## 2022-12-15 DIAGNOSIS — J4541 Moderate persistent asthma with (acute) exacerbation: Secondary | ICD-10-CM | POA: Diagnosis not present

## 2022-12-15 DIAGNOSIS — R030 Elevated blood-pressure reading, without diagnosis of hypertension: Secondary | ICD-10-CM

## 2022-12-15 MED ORDER — AEROCHAMBER MV MISC
1 refills | Status: AC
Start: 2022-12-15 — End: ?

## 2022-12-15 MED ORDER — ALBUTEROL SULFATE (2.5 MG/3ML) 0.083% IN NEBU
2.5000 mg | INHALATION_SOLUTION | Freq: Once | RESPIRATORY_TRACT | Status: AC
  Administered 2022-12-15: 2.5 mg via RESPIRATORY_TRACT

## 2022-12-15 MED ORDER — IPRATROPIUM-ALBUTEROL 0.5-2.5 (3) MG/3ML IN SOLN
3.0000 mL | Freq: Once | RESPIRATORY_TRACT | Status: AC
  Administered 2022-12-15: 3 mL via RESPIRATORY_TRACT

## 2022-12-15 MED ORDER — ALBUTEROL SULFATE HFA 108 (90 BASE) MCG/ACT IN AERS: 2.0000 | INHALATION_SPRAY | RESPIRATORY_TRACT | 0 refills | Status: DC | PRN

## 2022-12-15 MED ORDER — PREDNISONE 50 MG PO TABS: 50.0000 mg | ORAL_TABLET | Freq: Every day | ORAL | 0 refills | Status: DC

## 2022-12-15 MED ORDER — IPRATROPIUM-ALBUTEROL 0.5-2.5 (3) MG/3ML IN SOLN
RESPIRATORY_TRACT | Status: AC
  Filled 2022-12-15: qty 3

## 2022-12-15 MED ORDER — PREDNISONE 20 MG PO TABS
60.0000 mg | ORAL_TABLET | Freq: Once | ORAL | Status: AC
  Administered 2022-12-15: 60 mg via ORAL

## 2022-12-15 MED ORDER — PREDNISONE 20 MG PO TABS
ORAL_TABLET | ORAL | Status: AC
  Filled 2022-12-15: qty 3

## 2022-12-15 MED ORDER — ALBUTEROL SULFATE (2.5 MG/3ML) 0.083% IN NEBU
INHALATION_SOLUTION | RESPIRATORY_TRACT | Status: AC
  Filled 2022-12-15: qty 3
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
## 2022-12-15 NOTE — Discharge Instructions (Addendum)
2 puffs from your albuterol inhaler using your spacer every 4 hours for 2 days, then every 6 hours for 2 days, then as needed.  You can back off in your albuterol if you start to improve sooner.  Finish the prednisone, even if you feel better.  Try and stay away from things that trigger off your asthma/bronchitis for the next several days.  Decrease your salt intake. diet and exercise will lower your blood pressure significantly. It is important to keep your blood pressure under good control, as having a elevated blood pressure for prolonged periods of time significantly increases your risk of stroke, heart attacks, kidney damage, eye damage, and other problems. Get a validated blood pressure cuff that goes on your arm, not your wrist.  Measure your blood pressure once a day, preferably at the same time every day. Keep a log of this and bring it to your next doctor's appointment.  Bring your blood pressure cuff as well.  Return here in 2 weeks for blood pressure recheck if you're unable to find a primary care physician by then. Return immediately to the ER if you start having chest pain, headache, problems seeing, problems talking, problems walking, if you feel like you're about to pass out, if you do pass out, if you have a seizure, or for any other concerns.  Go to www.goodrx.com  or www.costplusdrugs.com to look up your medications. This will give you a list of where you can find your prescriptions at the most affordable prices. Or ask the pharmacist what the cash price is, or if they have any other discount programs available to help make your medication more affordable. This can be less expensive than what you would pay with insurance.
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
## 2022-12-27 ENCOUNTER — Emergency Department (HOSPITAL_BASED_OUTPATIENT_CLINIC_OR_DEPARTMENT_OTHER): Payer: 59

## 2022-12-27 ENCOUNTER — Emergency Department (HOSPITAL_BASED_OUTPATIENT_CLINIC_OR_DEPARTMENT_OTHER)
Admission: EM | Admit: 2022-12-27 | Discharge: 2022-12-27 | Disposition: A | Discharge status: Home / Self Care | Payer: 59 | Attending: Emergency Medicine | Admitting: Emergency Medicine

## 2022-12-27 ENCOUNTER — Encounter (HOSPITAL_BASED_OUTPATIENT_CLINIC_OR_DEPARTMENT_OTHER): Payer: Self-pay | Admitting: Emergency Medicine

## 2022-12-27 ENCOUNTER — Other Ambulatory Visit: Payer: Self-pay

## 2022-12-27 DIAGNOSIS — J45909 Unspecified asthma, uncomplicated: Secondary | ICD-10-CM | POA: Insufficient documentation

## 2022-12-27 DIAGNOSIS — I10 Essential (primary) hypertension: Secondary | ICD-10-CM | POA: Insufficient documentation

## 2022-12-27 DIAGNOSIS — J449 Chronic obstructive pulmonary disease, unspecified: Secondary | ICD-10-CM | POA: Insufficient documentation

## 2022-12-27 DIAGNOSIS — Z79899 Other long term (current) drug therapy: Secondary | ICD-10-CM | POA: Insufficient documentation

## 2022-12-27 DIAGNOSIS — F17210 Nicotine dependence, cigarettes, uncomplicated: Secondary | ICD-10-CM | POA: Insufficient documentation

## 2022-12-27 DIAGNOSIS — R519 Headache, unspecified: Secondary | ICD-10-CM | POA: Diagnosis present

## 2022-12-27 DIAGNOSIS — F419 Anxiety disorder, unspecified: Secondary | ICD-10-CM | POA: Diagnosis not present

## 2022-12-27 LAB — CBC
HCT: 47.3 % (ref 39.0–52.0)
Hemoglobin: 15.4 g/dL (ref 13.0–17.0)
MCH: 28.2 pg (ref 26.0–34.0)
MCHC: 32.6 g/dL (ref 30.0–36.0)
MCV: 86.6 fL (ref 80.0–100.0)
Platelets: 319 10*3/uL (ref 150–400)
RBC: 5.46 MIL/uL (ref 4.22–5.81)
RDW: 15 % (ref 11.5–15.5)
WBC: 8.4 10*3/uL (ref 4.0–10.5)
nRBC: 0 % (ref 0.0–0.2)

## 2022-12-27 LAB — BASIC METABOLIC PANEL
Anion gap: 10 (ref 5–15)
BUN: 13 mg/dL (ref 6–20)
CO2: 25 mmol/L (ref 22–32)
Calcium: 9.9 mg/dL (ref 8.9–10.3)
Chloride: 104 mmol/L (ref 98–111)
Creatinine, Ser: 1.08 mg/dL (ref 0.61–1.24)
GFR, Estimated: >60 mL/min (ref >60)
Glucose, Bld: 94 mg/dL (ref 70–99)
Potassium: 3.8 mmol/L (ref 3.5–5.1)
Sodium: 139 mmol/L (ref 135–145)

## 2022-12-27 MED ORDER — AMLODIPINE BESYLATE 5 MG PO TABS: 5.0000 mg | ORAL_TABLET | Freq: Every day | ORAL | 1 refills | Status: DC

## 2022-12-27 MED ORDER — DIPHENHYDRAMINE HCL 50 MG/ML IJ SOLN
25.0000 mg | Freq: Once | INTRAMUSCULAR | Status: AC
  Administered 2022-12-27: 25 mg via INTRAVENOUS
  Filled 2022-12-27: qty 1

## 2022-12-27 MED ORDER — HYDROXYZINE HCL 25 MG PO TABS: 25.0000 mg | ORAL_TABLET | Freq: Four times a day (QID) | ORAL | 0 refills | Status: DC | PRN

## 2022-12-27 MED ORDER — KETOROLAC TROMETHAMINE 30 MG/ML IJ SOLN
15.0000 mg | Freq: Once | INTRAMUSCULAR | Status: AC
  Administered 2022-12-27: 15 mg via INTRAVENOUS
  Filled 2022-12-27: qty 1

## 2022-12-27 MED ORDER — PROCHLORPERAZINE EDISYLATE 10 MG/2ML IJ SOLN
10.0000 mg | Freq: Once | INTRAMUSCULAR | Status: AC
  Administered 2022-12-27: 10 mg via INTRAVENOUS
  Filled 2022-12-27: qty 2

## 2022-12-27 MED ORDER — SODIUM CHLORIDE 0.9 % IV BOLUS
500.0000 mL | Freq: Once | INTRAVENOUS | Status: AC
  Administered 2022-12-27: 500 mL via INTRAVENOUS

## 2022-12-27 NOTE — Discharge Instructions (Addendum)
Note the workup today was overall reassuring.  As discussed, labs and imaging studies were all within normal range.  Recommend adding daily blood pressure medicine in the form of amlodipine 5 mg to take daily.  Please follow-up with your primary care for reassessment of your blood pressure once beginning the medication for future adjustment.  If you do not have a primary care, call number attached your discharge papers.  I also sent a medicine called hydroxyzine to take as needed for feelings of anxiousness.  Attached is resources regarding counseling.  Please do not hesitate to return to emergency department if the worrisome signs and symptoms we discussed become apparent.
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
## 2023-01-11 DIAGNOSIS — Z419 Encounter for procedure for purposes other than remedying health state, unspecified: Secondary | ICD-10-CM | POA: Diagnosis not present

## 2023-02-09 DIAGNOSIS — Z419 Encounter for procedure for purposes other than remedying health state, unspecified: Secondary | ICD-10-CM | POA: Diagnosis not present

## 2023-02-26 NOTE — Progress Notes (Signed)
Subjective:    Alexander Rose - 37 y.o. male MRN [id number]  Date of birth: Oct 26, 1986  HPI  Alexander Rose is to establish care.  Current issues and/or concerns: - Doing well on Albuterol inhaler and using only as needed. Denies red flag symptoms.  - Doing well on Amlodipine, no issues/concerns. He is not checking his blood pressure outside of office. He is trying to monitor salt intake. He is planning to begin exercising soon. He does consume caffeine/coffee. He does smoke. He denies red flag symptoms such as but not limited to chest pain, shortness of breath, worst headache of life, nausea/vomiting.  - Increased anxiety due to work-life balance. He has 3 children ages 64 y.o., 71 y.o., and 3 y.o. He is not working presently due to work-related issues that he is speaking with corporate office about resolving. He does have stable housing and adequate food. States he feels increased irritability and sometimes paranoid. He denies thoughts of self-harm, suicidal ideations, homicidal ideations. He would like a referral to Psychiatry.  - No further issues/concerns for discussion today.    ROS per HPI   Health Maintenance:  Health Maintenance Due  Topic Date Due   COVID-19 Vaccine (1) Never done   HIV Screening  Never done   Hepatitis C Screening  Never done   DTaP/Tdap/Td (1 - Tdap) Never done   INFLUENZA VACCINE  Never done     Past Medical History: Patient Active Problem List   Diagnosis Date Noted   High blood pressure 12/27/2022   Anxiety and depression 04/01/2020      Social History   reports that he has been smoking cigarettes. He has a 12.00 pack-year smoking history. He has been exposed to tobacco smoke. He has never used smokeless tobacco. He reports current alcohol use of about 1.0 standard drink of alcohol per week. He reports that he does not use drugs.   Family History  family history includes Healthy in his father and mother; Hyperlipidemia in his brother; Kidney  failure in his maternal grandmother.   Medications: reviewed and updated   Objective:   Physical Exam BP (!) 151/90   Pulse 67   Temp 98.3 F (36.8 C)   Resp 16   Ht 5\' 10"  (1.778 m)   Wt 220 lb (99.8 kg)   SpO2 97%   BMI 31.57 kg/m   Physical Exam HENT:     Head: Normocephalic and atraumatic.  Eyes:     Extraocular Movements: Extraocular movements intact.     Conjunctiva/sclera: Conjunctivae normal.     Pupils: Pupils are equal, round, and reactive to light.  Cardiovascular:     Rate and Rhythm: Normal rate and regular rhythm.     Pulses: Normal pulses.     Heart sounds: Normal heart sounds.  Pulmonary:     Effort: Pulmonary effort is normal.     Breath sounds: Normal breath sounds.  Musculoskeletal:     Cervical back: Normal range of motion and neck supple.  Neurological:     General: No focal deficit present.     Mental Status: He is alert and oriented to person, place, and time.  Psychiatric:        Mood and Affect: Mood normal.        Behavior: Behavior normal.       Assessment & Plan:  1. Encounter to establish care - Patient presents today to establish care. During the interim follow-up with primary provider as scheduled.  - Return for annual  physical examination, labs, and health maintenance. Arrive fasting meaning having no food for at least 8 hours prior to appointment. You may have only water or black coffee. Please take scheduled medications as normal.  2. Primary hypertension - Blood pressure not at goal during today's visit. Patient asymptomatic without chest pressure, chest pain, palpitations, shortness of breath, worst headache of life, and any additional red flag symptoms. - Counseled patient on recommendation to increase Amlodipine dose and patient declined.  - Continue Amlodipine as prescribed.  - Counseled on blood pressure goal of less than 130/80, low-sodium, DASH diet, medication compliance, and 150 minutes of moderate intensity exercise per  week as tolerated. Counseled on medication adherence and adverse effects. - Follow-up with primary provider in 2 weeks or sooner if needed.  - amLODipine (NORVASC) 5 MG tablet; Take 1 tablet (5 mg total) by mouth daily.  Dispense: 30 tablet; Refill: 2  3. Moderate persistent asthma, unspecified whether complicated - Continue Albuterol inhaler as prescribed.  - Follow-up with primary provider as scheduled.  - albuterol (VENTOLIN HFA) 108 (90 Base) MCG/ACT inhaler; Inhale 2 puffs into the lungs every 4 (four) hours as needed for wheezing or shortness of breath.  Dispense: 18 g; Refill: 2  4. Anxiousness - Patient denies thoughts of self-harm, suicidal ideations, homicidal ideations. - Continue Hydroxyzine as prescribed.  - Referral to Rosana Hoes, LCSWA for counseling/community resources.  - Referral to Psychiatry for further evaluation/management. During the interim follow-up with primary provider as scheduled.  - Ambulatory referral to Psychiatry - hydrOXYzine (ATARAX) 25 MG tablet; Take 1 tablet (25 mg total) by mouth every 6 (six) hours as needed for anxiety.  Dispense: 30 tablet; Refill: 2    Patient was given clear instructions to go to Emergency Department or return to medical center if symptoms don't improve, worsen, or new problems develop.The patient verbalized understanding.  I discussed the assessment and treatment plan with the patient. The patient was provided an opportunity to ask questions and all were answered. The patient agreed with the plan and demonstrated an understanding of the instructions.   The patient was advised to call back or seek an in-person evaluation if the symptoms worsen or if the condition fails to improve as anticipated.    Durene Fruits, NP 03/02/2023, 9:11 AM Primary Care at [facility]

## 2023-03-02 ENCOUNTER — Encounter: Payer: Self-pay | Admitting: Family

## 2023-03-02 ENCOUNTER — Telehealth: Payer: Self-pay | Admitting: Family

## 2023-03-02 ENCOUNTER — Ambulatory Visit (INDEPENDENT_AMBULATORY_CARE_PROVIDER_SITE_OTHER): Payer: 59 | Admitting: Family

## 2023-03-02 VITALS — BP 151/90 | HR 67 | Temp 98.3°F | Resp 16 | Ht 70.0 in | Wt 220.0 lb

## 2023-03-02 DIAGNOSIS — F419 Anxiety disorder, unspecified: Secondary | ICD-10-CM | POA: Diagnosis not present

## 2023-03-02 DIAGNOSIS — F17210 Nicotine dependence, cigarettes, uncomplicated: Secondary | ICD-10-CM

## 2023-03-02 DIAGNOSIS — J4540 Moderate persistent asthma, uncomplicated: Secondary | ICD-10-CM

## 2023-03-02 DIAGNOSIS — I10 Essential (primary) hypertension: Secondary | ICD-10-CM

## 2023-03-02 DIAGNOSIS — Z7689 Persons encountering health services in other specified circumstances: Secondary | ICD-10-CM

## 2023-03-02 MED ORDER — AMLODIPINE BESYLATE 5 MG PO TABS: 5.0000 mg | ORAL_TABLET | Freq: Every day | ORAL | 2 refills | Status: DC

## 2023-03-02 MED ORDER — ALBUTEROL SULFATE HFA 108 (90 BASE) MCG/ACT IN AERS: 2.0000 | INHALATION_SPRAY | RESPIRATORY_TRACT | 2 refills | Status: AC | PRN

## 2023-03-02 MED ORDER — HYDROXYZINE HCL 25 MG PO TABS: 25.0000 mg | ORAL_TABLET | Freq: Four times a day (QID) | ORAL | 2 refills | Status: AC | PRN

## 2023-03-02 NOTE — Patient Instructions (Signed)
Thank you for choosing Primary Care at [facility] for your medical home!    Alexander Rose was seen by Camillia Herter, NP today.   Alexander Rose's primary care provider is Durene Fruits, NP.   For the best care possible,  you should try to see Durene Fruits, NP whenever you come to office.   We look forward to seeing you again soon!  If you have any questions about your visit today,  please call us at [phone]  Or feel free to reach your provider via [place].   Keeping you healthy   Get these tests Blood pressure- Have your blood pressure checked once a year by your healthcare provider.  Normal blood pressure is 120/80. Weight- Have your body mass index (BMI) calculated to screen for obesity.  BMI is a measure of body fat based on height and weight. You can also calculate your own BMI at [employer].it. Cholesterol- Have your cholesterol checked regularly starting at age 42, sooner may be necessary if you have diabetes, high blood pressure, if a family member developed heart diseases at an early age or if you smoke.  Chlamydia, HIV, and other sexual transmitted disease- Get screened each year until the age of 81 then within three months of each new sexual partner. Diabetes- Have your blood sugar checked regularly if you have high blood pressure, high cholesterol, a family history of diabetes or if you are overweight.   Get these vaccines Flu shot- Every fall. Tetanus shot- Every 10 years. Menactra- Single dose; prevents meningitis.   Take these steps Don't smoke- If you do smoke, ask your healthcare provider about quitting. For tips on how to quit, go to www.smokefree.gov or call 1-800-QUIT-NOW. Be physically active- Exercise 5 days a week for at least 30 minutes.  If you are not already physically active start slow and gradually work up to 30 minutes of moderate physical activity.  Examples of moderate activity include walking briskly, mowing the yard, dancing,  swimming bicycling, etc. Eat a healthy diet- Eat a variety of healthy foods such as fruits, vegetables, low fat milk, low fat cheese, yogurt, lean meats, poultry, fish, beans, tofu, etc.  For more information on healthy eating, go to www.thenutritionsource.org Drink alcohol in moderation- Limit alcohol intake two drinks or less a day.  Never drink and drive. Dentist- Brush and floss teeth twice daily; visit your dentis twice a year. Depression-Your emotional health is as important as your physical health.  If you're feeling down, losing interest in things you normally enjoy please talk with your healthcare provider. Gun Safety- If you keep a gun in your home, keep it unloaded and with the safety lock on.  Bullets should be stored separately. Helmet use- Always wear a helmet when riding a motorcycle, bicycle, rollerblading or skateboarding. Safe sex- If you may be exposed to a sexually transmitted infection, use a condom Seat belts- Seat bels can save your life; always wear one. Smoke/Carbon Monoxide detectors- These detectors need to be installed on the appropriate level of your home.  Replace batteries at least once a year. Skin Cancer- When out in the sun, cover up and use sunscreen SPF 15 or higher. Violence- If anyone is threatening or hurting you, please tell your healthcare provider.

## 2023-03-02 NOTE — Progress Notes (Signed)
.  Pt presents to establish care,  -request referral to Psychiatry -pt was having migraine headaches due to stressors of life

## 2023-03-08 NOTE — Telephone Encounter (Signed)
LCSWA spoke with pt and provided resources for ongoing counseling services via my chart. Pt also discussed he is looking for a blood pressure machine. LCSWA gave him so options. Counseling Resources   [email or web address]  [facility] [address] [place], [place] [address] [phone] or [phone] Walk-in urgent care 24/7 for anyone  For [facility] ONLY New patient assessments and therapy walk-ins: Monday and Wednesday 8am-11am First and second Friday 1pm-5pm New patient psychiatry and medication management walk-ins: Mondays, Wednesdays, Thursdays, Fridays 8am-11am No psychiatry walk-ins on Tuesday   *Accepts all insurance and uninsured for Urgent Care needs *Accepts Medicaid and uninsured for outpatient treatment   [facility] (Therapy and psychiatry) [facility] (near [place]) [address], [place], [place] [address] [phone] Fax: [phone] ([place])   [facility] [place],  [place]  [address] [phone] Call for appointment  [facility] (Therapy only)  [facility] [address], [place], [place] [address] Monday - Friday: 8:30 a.m.-12 p.m. / 1 p.m.-2:30 p.m.  [facility] [address], [place], [place] [address] Monday-Friday: 8:30 a.m.-12 p.m. / 2-3:30 p.m. (INSURANCE REQUIRED -MEDICAID ACCEPTED) They do offer a sliding fee scale $20-$30/session   [facility] [place], [place] [address] Phone: [place] [address] [place] [address]  Phone: [phone] (Does not accept Medicaid) (only one provider accepts Medicare)  [facility] [address]at [place], [place]  [phone] (Accepts Medicaid and Medicare)  [facility]) [place] [address] [place]  [place], [place] [address]  Phone: [phone]  [address] [place], [place] [address] Phone: [phone] [place] Day Medicaid) [facility] (Therapy only)  [address], [place], [place] [address] Phone: [phone]   [facility] (Therapy only)  [place], [place] [address] Phone: [phone] [facility])   [facility] [address], [place], [place] [address] Phone: [phone] ([place]) [facility] [phone] [address] [place], [place] [address] Phone: [phone] [facility]) [facility] (Psychiatry only)  [phone] [address], [place], [place] [address] (Accepts Medicaid and Medicare) [facility] (Psychiatry and therapy)  [place], [place], [place] [address] [phone] [facility] Medicare) [facility] (psychiatry and therapy) [address], [place], [place] [address] [phone]  [facility] at [address], [place], [place], [place] [address] [phone] Accept [address] [place], [place], [place], [place],  and the following types of Medicaid; [place], [place], Partners, [place], [place], [employer], Healthy [place], [place] Complete, and [place], as well as offering a Manufacturing systems engineer and private payment options. Provides In-Office Appointments, Virtual Appointments, and Phone Consultations Offers medication management for ages 49 years old and up, including,  Medication Management for Suboxone and [place] [phone] [address], [place],  [address] ([place] Medicaid and Medicare)         19.  [facility] (therapy only)  [address] [place],  [address]             [phone] (Accepts medicare) 20. [facility]  (Suboxone and methodone) [phone]  [address] [place], [place] [address] To Be Eligible for Opioid Treatment at [facility] you must be at least 35 years of age you have already tried other interventions that were not successful such as opioid detox, inpatient rehab for opioids, or outpatient counseling specifically for opioid dependency your [facility] drug test must be completely free of benzodiazepines (klonopin, xanax, valium, ativan, or other benz) you have reliable transportation to the [facility] clinic in [place] you recognize that counseling is a critical component of [facility]' Opioid Program and you agree to attend all required counseling sessions you are committed to total drug abstinence and will conscientiously strive to remain free of alcohol, marijuana, and other illicit substances while in treatment you desire a peaceful treatment atmosphere in which personal responsibility and respect toward staff and clients is the norm   21. [facility] [place], [place], [place] [address] Offers SAIOP (Substance Abuse Intensive Outpatient Program) [phone] 22. [facility] [address] [place], [place] [address] [phone] (Accepts medicare)  For those who are tech savvy, go on psychology today, type in your local city (i.e. [place]. [place]) and specify your insurance at the top of the screen after you search. (Medicaid if needed). You can also specify whether you are interested in therapy and psychiatry.  www.psychologytoday.com/us

## 2023-03-12 DIAGNOSIS — Z419 Encounter for procedure for purposes other than remedying health state, unspecified: Secondary | ICD-10-CM | POA: Diagnosis not present

## 2023-03-12 NOTE — Progress Notes (Signed)
Erroneous encounter-disregard

## 2023-03-16 ENCOUNTER — Encounter: Payer: 59 | Admitting: Family

## 2023-03-16 DIAGNOSIS — I10 Essential (primary) hypertension: Secondary | ICD-10-CM

## 2023-04-11 DIAGNOSIS — Z419 Encounter for procedure for purposes other than remedying health state, unspecified: Secondary | ICD-10-CM | POA: Diagnosis not present

## 2023-04-20 ENCOUNTER — Ambulatory Visit (HOSPITAL_COMMUNITY): Payer: 59 | Admitting: Clinical

## 2023-04-30 NOTE — Progress Notes (Deleted)
Patient ID: Alexander Rose, male    DOB: 06-09-1986  MRN: [id number]  CC: Annual Physical Exam  Subjective: Alexander Rose is a 37 y.o. male who presents for annual physical exam.  His concerns today include:  HTN - Amlodipine  Patient Active Problem List   Diagnosis Date Noted   High blood pressure 12/27/2022   Anxiety and depression 04/01/2020     Current Outpatient Medications on File Prior to Visit  Medication Sig Dispense Refill   albuterol (VENTOLIN HFA) 108 (90 Base) MCG/ACT inhaler Inhale 2 puffs into the lungs every 4 (four) hours as needed for wheezing or shortness of breath. 18 g 2   amLODipine (NORVASC) 5 MG tablet Take 1 tablet (5 mg total) by mouth daily. 30 tablet 2   hydrOXYzine (ATARAX) 25 MG tablet Take 1 tablet (25 mg total) by mouth every 6 (six) hours as needed for anxiety. 30 tablet 2   Spacer/Aero-Holding Chambers (AEROCHAMBER MV) inhaler Use as instructed 1 each 1   [DISCONTINUED] sertraline (ZOLOFT) 50 MG tablet Take 1 tablet (50 mg total) by mouth daily. 90 tablet 0   No current facility-administered medications on file prior to visit.    No Known Allergies  Social History   Socioeconomic History   Marital status: Significant Other    Spouse name: Not on file   Number of children: Not on file   Years of education: Not on file   Highest education level: Not on file  Occupational History   Not on file  Tobacco Use   Smoking status: Every Day    Packs/day: 1.00    Years: 12.00    Additional pack years: 0.00    Total pack years: 12.00    Types: Cigarettes    Passive exposure: Current   Smokeless tobacco: Never  Vaping Use   Vaping Use: Never used  Substance and Sexual Activity   Alcohol use: Yes    Alcohol/week: 1.0 standard drink of alcohol    Types: 1 Standard drinks or equivalent per week    Comment: occassionally   Drug use: Never   Sexual activity: Yes  Other Topics Concern   Not on file  Social History Narrative   Not on  file   Social Determinants of Health   Financial Resource Strain: Not on file  Food Insecurity: Not on file  Transportation Needs: Not on file  Physical Activity: Not on file  Stress: Not on file  Social Connections: Not on file  Intimate Partner Violence: Not on file    Family History  Problem Relation Age of Onset   Healthy Mother    Healthy Father    Hyperlipidemia Brother    Kidney failure Maternal Grandmother     Past Surgical History:  Procedure Laterality Date   FEMUR FRACTURE SURGERY      ROS: Review of Systems Negative except as stated above  PHYSICAL EXAM: There were no vitals taken for this visit.  Physical Exam  {male adult master:310786} {male adult master:310785}     Latest Ref Rng & Units 12/27/2022   11:40 AM  CMP  Glucose 70 - 99 mg/dL 94   BUN 6 - 20 mg/dL 13   Creatinine 4.09 - 1.24 mg/dL 8.11   Sodium 914 - 782 mmol/L 139   Potassium 3.5 - 5.1 mmol/L 3.8   Chloride 98 - 111 mmol/L 104   CO2 22 - 32 mmol/L 25   Calcium 8.9 - 10.3 mg/dL 9.9    Lipid  Panel  No results found for: "CHOL", "TRIG", "HDL", "CHOLHDL", "VLDL", "LDLCALC", "LDLDIRECT"  CBC    Component Value Date/Time   WBC 8.4 12/27/2022 1140   RBC 5.46 12/27/2022 1140   HGB 15.4 12/27/2022 1140   HCT 47.3 12/27/2022 1140   PLT 319 12/27/2022 1140   MCV 86.6 12/27/2022 1140   MCH 28.2 12/27/2022 1140   MCHC 32.6 12/27/2022 1140   RDW 15.0 12/27/2022 1140    ASSESSMENT AND PLAN:  There are no diagnoses linked to this encounter.   Patient was given the opportunity to ask questions.  Patient verbalized understanding of the plan and was able to repeat key elements of the plan. Patient was given clear instructions to go to Emergency Department or return to medical center if symptoms don't improve, worsen, or new problems develop.The patient verbalized understanding.   No orders of the defined types were placed in this encounter.    Requested Prescriptions    No  prescriptions requested or ordered in this encounter    No follow-ups on file.  Rema Fendt, NP

## 2023-05-02 ENCOUNTER — Encounter: Payer: Medicaid Other | Admitting: Family

## 2023-05-02 DIAGNOSIS — Z1329 Encounter for screening for other suspected endocrine disorder: Secondary | ICD-10-CM

## 2023-05-02 DIAGNOSIS — Z114 Encounter for screening for human immunodeficiency virus [HIV]: Secondary | ICD-10-CM

## 2023-05-02 DIAGNOSIS — I10 Essential (primary) hypertension: Secondary | ICD-10-CM

## 2023-05-02 DIAGNOSIS — Z13228 Encounter for screening for other metabolic disorders: Secondary | ICD-10-CM

## 2023-05-02 DIAGNOSIS — Z1159 Encounter for screening for other viral diseases: Secondary | ICD-10-CM

## 2023-05-02 DIAGNOSIS — Z131 Encounter for screening for diabetes mellitus: Secondary | ICD-10-CM

## 2023-05-02 DIAGNOSIS — Z0000 Encounter for general adult medical examination without abnormal findings: Secondary | ICD-10-CM

## 2023-05-02 DIAGNOSIS — Z13220 Encounter for screening for lipoid disorders: Secondary | ICD-10-CM

## 2023-05-08 NOTE — Progress Notes (Signed)
Erroneous encounter-disregard

## 2023-05-11 ENCOUNTER — Encounter: Payer: Medicaid Other | Admitting: Family

## 2023-05-11 DIAGNOSIS — Z114 Encounter for screening for human immunodeficiency virus [HIV]: Secondary | ICD-10-CM

## 2023-05-11 DIAGNOSIS — Z13228 Encounter for screening for other metabolic disorders: Secondary | ICD-10-CM

## 2023-05-11 DIAGNOSIS — Z131 Encounter for screening for diabetes mellitus: Secondary | ICD-10-CM

## 2023-05-11 DIAGNOSIS — Z13220 Encounter for screening for lipoid disorders: Secondary | ICD-10-CM

## 2023-05-11 DIAGNOSIS — I10 Essential (primary) hypertension: Secondary | ICD-10-CM

## 2023-05-11 DIAGNOSIS — Z0000 Encounter for general adult medical examination without abnormal findings: Secondary | ICD-10-CM

## 2023-05-11 DIAGNOSIS — Z1329 Encounter for screening for other suspected endocrine disorder: Secondary | ICD-10-CM

## 2023-05-11 DIAGNOSIS — Z1159 Encounter for screening for other viral diseases: Secondary | ICD-10-CM

## 2023-05-12 DIAGNOSIS — Z419 Encounter for procedure for purposes other than remedying health state, unspecified: Secondary | ICD-10-CM | POA: Diagnosis not present

## 2023-06-11 DIAGNOSIS — Z419 Encounter for procedure for purposes other than remedying health state, unspecified: Secondary | ICD-10-CM | POA: Diagnosis not present

## 2023-07-08 ENCOUNTER — Other Ambulatory Visit: Payer: Self-pay | Admitting: Family

## 2023-07-08 DIAGNOSIS — I10 Essential (primary) hypertension: Secondary | ICD-10-CM

## 2023-07-09 ENCOUNTER — Encounter: Payer: Medicaid Other | Admitting: Family

## 2023-07-09 ENCOUNTER — Other Ambulatory Visit: Payer: Self-pay

## 2023-07-09 DIAGNOSIS — I10 Essential (primary) hypertension: Secondary | ICD-10-CM

## 2023-07-09 MED ORDER — AMLODIPINE BESYLATE 5 MG PO TABS: 5.0000 mg | ORAL_TABLET | Freq: Every day | ORAL | 2 refills | Status: AC

## 2023-07-12 DIAGNOSIS — Z419 Encounter for procedure for purposes other than remedying health state, unspecified: Secondary | ICD-10-CM | POA: Diagnosis not present

## 2023-08-12 DIAGNOSIS — Z419 Encounter for procedure for purposes other than remedying health state, unspecified: Secondary | ICD-10-CM | POA: Diagnosis not present

## 2023-09-11 DIAGNOSIS — Z419 Encounter for procedure for purposes other than remedying health state, unspecified: Secondary | ICD-10-CM | POA: Diagnosis not present

## 2023-10-12 DIAGNOSIS — Z419 Encounter for procedure for purposes other than remedying health state, unspecified: Secondary | ICD-10-CM | POA: Diagnosis not present

## 2023-11-11 DIAGNOSIS — Z419 Encounter for procedure for purposes other than remedying health state, unspecified: Secondary | ICD-10-CM | POA: Diagnosis not present

## 2023-12-12 DIAGNOSIS — Z419 Encounter for procedure for purposes other than remedying health state, unspecified: Secondary | ICD-10-CM | POA: Diagnosis not present

## 2024-01-12 DIAGNOSIS — Z419 Encounter for procedure for purposes other than remedying health state, unspecified: Secondary | ICD-10-CM | POA: Diagnosis not present

## 2024-02-09 DIAGNOSIS — Z419 Encounter for procedure for purposes other than remedying health state, unspecified: Secondary | ICD-10-CM | POA: Diagnosis not present

## 2024-03-02 IMAGING — DX DG CHEST 2V
2 series · 2 of 2 positions shown · non-contrast
Comparison: 07/03/2020

CLINICAL DATA: Central chest pain

EXAM:
CHEST - 2 VIEW

[chest pa]
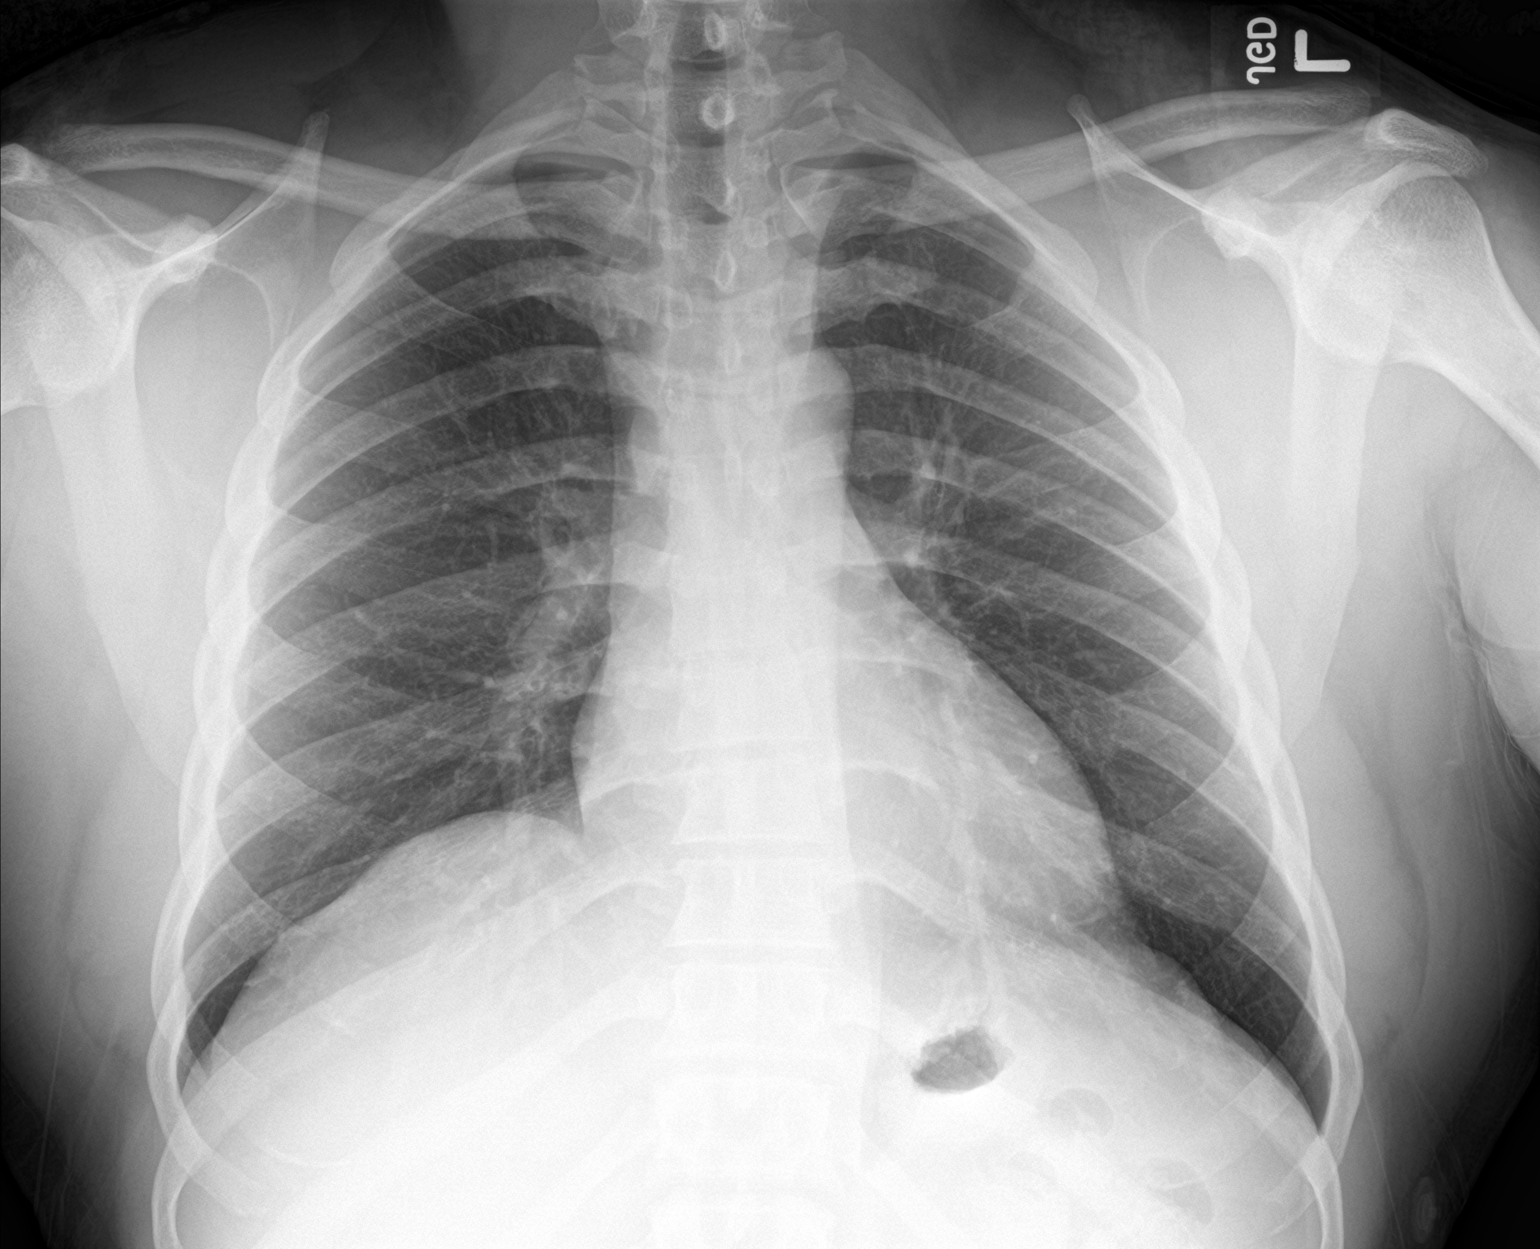

[chest lat]
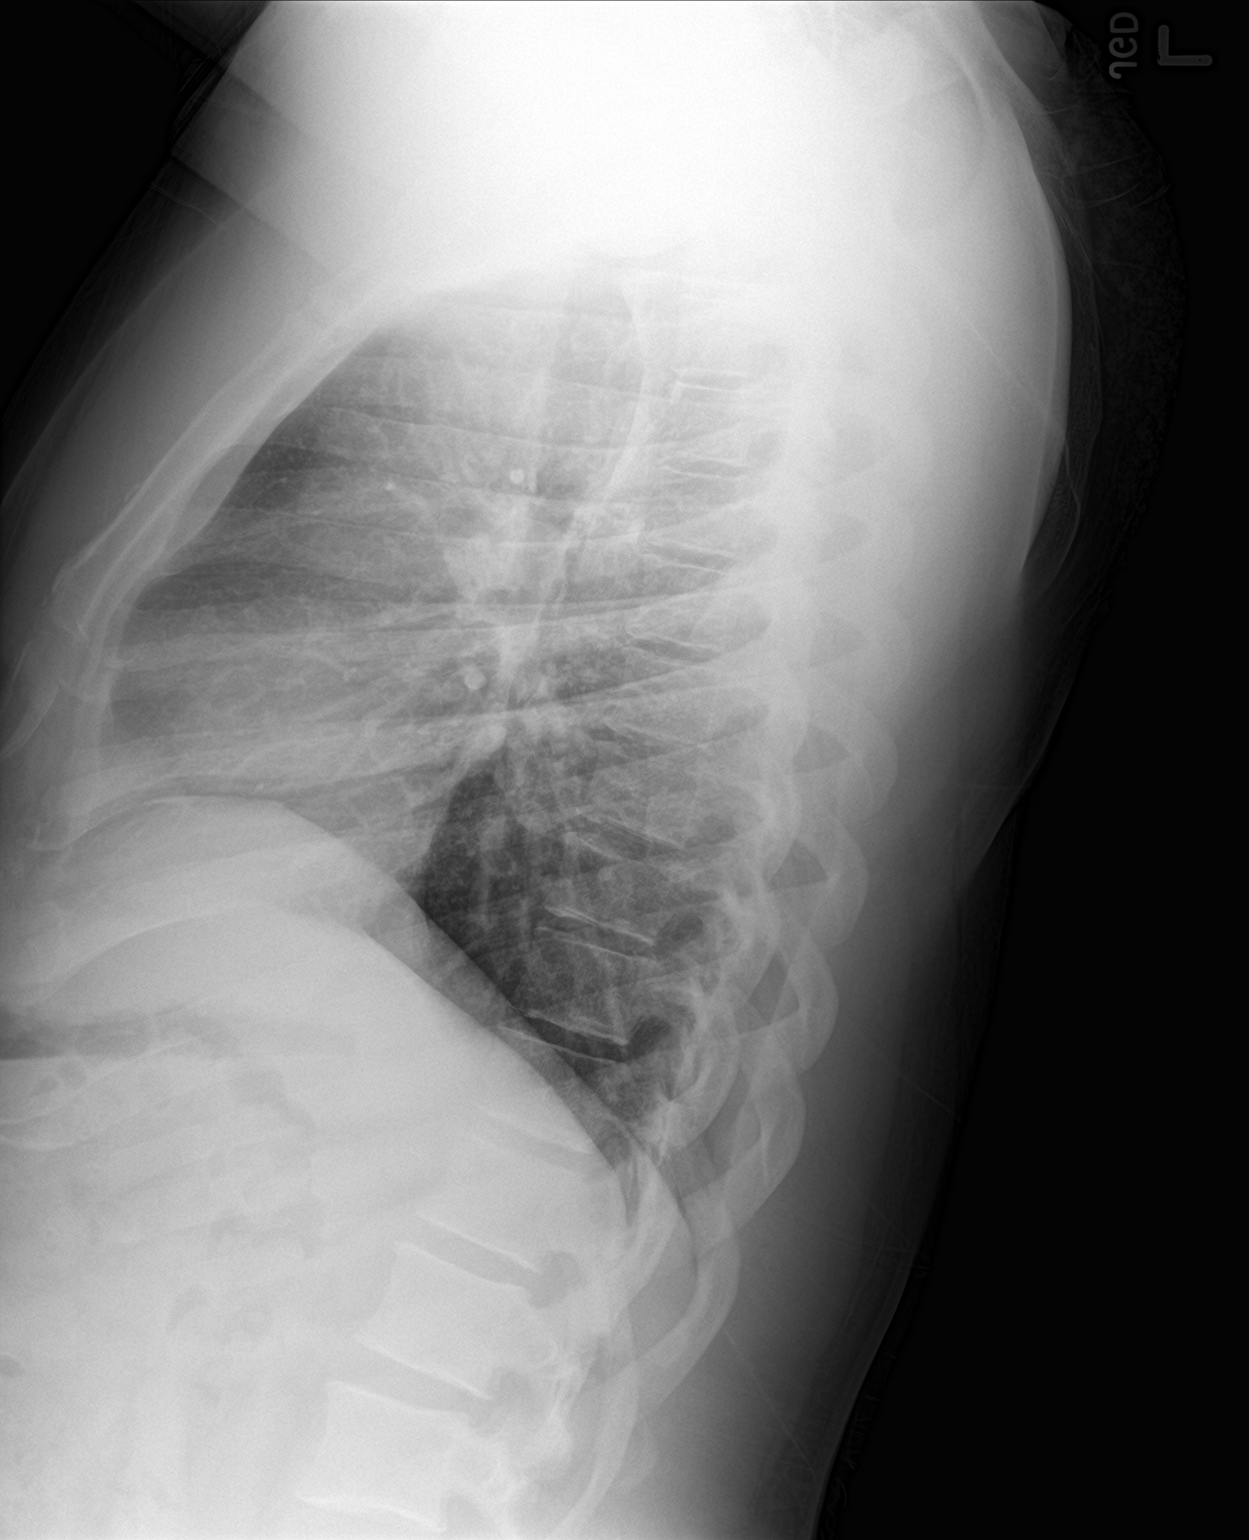

[2 of 2 positions shown; findings below may reference images not displayed]

FINDINGS: The heart size and mediastinal contours are within normal limits.
Both lungs are clear. The visualized skeletal structures are
unremarkable.
IMPRESSION: No active cardiopulmonary disease.

## 2024-03-22 DIAGNOSIS — Z419 Encounter for procedure for purposes other than remedying health state, unspecified: Secondary | ICD-10-CM | POA: Diagnosis not present

## 2024-03-29 ENCOUNTER — Ambulatory Visit (HOSPITAL_COMMUNITY)
Admission: EM | Admit: 2024-03-29 | Discharge: 2024-03-29 | Disposition: A | Discharge status: Home / Self Care | Attending: Emergency Medicine | Admitting: Emergency Medicine

## 2024-03-29 ENCOUNTER — Encounter (HOSPITAL_COMMUNITY): Payer: Self-pay

## 2024-03-29 DIAGNOSIS — Z202 Contact with and (suspected) exposure to infections with a predominantly sexual mode of transmission: Secondary | ICD-10-CM | POA: Insufficient documentation

## 2024-03-29 DIAGNOSIS — M25521 Pain in right elbow: Secondary | ICD-10-CM | POA: Insufficient documentation

## 2024-03-29 DIAGNOSIS — Z113 Encounter for screening for infections with a predominantly sexual mode of transmission: Secondary | ICD-10-CM | POA: Insufficient documentation

## 2024-03-29 MED ORDER — METRONIDAZOLE 500 MG PO TABS
ORAL_TABLET | ORAL | Status: AC
  Filled 2024-03-29: qty 4

## 2024-03-29 MED ORDER — METRONIDAZOLE 500 MG PO TABS
2000.0000 mg | ORAL_TABLET | Freq: Once | ORAL | Status: AC
  Administered 2024-03-29: 2000 mg via ORAL

## 2024-03-29 NOTE — Discharge Instructions (Addendum)
 Rest - try to avoid heavy lifting and high impact activity Ice - apply for 20 minutes a few times daily Compression - use ace wrap for support Elevation - prop up on a pillow  You can take ibuprofen  every 6 hours as needed for pain Follow up with orthopedics if pain persists   We will call you if anything on your swab returns positive. You can also see these results on MyChart. Please abstain from sexual intercourse until your results return. I have treated you for trichomonas today with Flagyl .

## 2024-03-29 NOTE — ED Provider Notes (Signed)
 [facility]    CSN: [id number] Arrival date & time: 03/29/24  1547     History   Chief Complaint Chief Complaint  Patient presents with   Elbow Pain   Exposure to STD    HPI Alexander Rose is a 38 y.o. male.  Here with 2 week history of right elbow pain Reporting "fluid" sensation. No injury, trauma, fall. He works at [employer] and does a lot of repetitive movements.  Has not tried anything yet No prior elbow issues  Also requesting STD testing. Reports partner has trichomonas and told him recently. They had unprotected intercourse. Not having any symptoms currently  History HTN, supposed to take amlodipine . States it takes intermittently. Has not seen PCP in over a year (no show)  Past Medical History:  Diagnosis Date   Anxiety    Depression     Patient Active Problem List   Diagnosis Date Noted   High blood pressure 12/27/2022   Anxiety and depression 04/01/2020    Past Surgical History:  Procedure Laterality Date   FEMUR FRACTURE SURGERY         Home Medications    Prior to Admission medications   Medication Sig Start Date End Date Taking? Authorizing Provider  amLODipine  (NORVASC ) 5 MG tablet Take 1 tablet (5 mg total) by mouth daily. 07/09/23  Yes Rogerio Clay, Amy J, NP  albuterol  (VENTOLIN  HFA) 108 (90 Base) MCG/ACT inhaler Inhale 2 puffs into the lungs every 4 (four) hours as needed for wheezing or shortness of breath. 03/02/23   Senaida Dama, NP  hydrOXYzine  (ATARAX ) 25 MG tablet Take 1 tablet (25 mg total) by mouth every 6 (six) hours as needed for anxiety. 03/02/23   Senaida Dama, NP  Spacer/Aero-Holding Idelle Majors (AEROCHAMBER MV) inhaler Use as instructed 12/15/22   Ethlyn Herd, MD  sertraline  (ZOLOFT ) 50 MG tablet Take 1 tablet (50 mg total) by mouth daily. 06/06/20 02/27/21  Adolph Hoop, PA-C    Family History Family History  Problem Relation Age of Onset   Healthy Mother    Healthy Father    Hyperlipidemia Brother    Kidney  failure Maternal Grandmother     Social History Social History   Tobacco Use   Smoking status: Every Day    Current packs/day: 1.00    Average packs/day: 1 pack/day for 12.0 years (12.0 ttl pk-yrs)    Types: Cigarettes    Passive exposure: Current   Smokeless tobacco: Never  Vaping Use   Vaping status: Never Used  Substance Use Topics   Alcohol use: Yes    Alcohol/week: 1.0 standard drink of alcohol    Types: 1 Standard drinks or equivalent per week    Comment: occassionally   Drug use: Never     Allergies   Patient has no known allergies.   Review of Systems Review of Systems Per HPI  Physical Exam Triage Vital Signs ED Triage Vitals  Encounter Vitals Group     BP 03/29/24 1636 (!) 149/104     Systolic BP Percentile --      Diastolic BP Percentile --      Pulse Rate 03/29/24 1636 72     Resp 03/29/24 1636 16     Temp 03/29/24 1636 (!) 97.4 F (36.3 C)     Temp Source 03/29/24 1636 Oral     SpO2 03/29/24 1636 95 %     Weight --      Height --      Head Circumference --  Peak Flow --      Pain Score 03/29/24 1638 0     Pain Loc --      Pain Education --      Exclude from Growth Chart --    No data found.  Updated Vital Signs BP (!) 140/109   Pulse 72   Temp (!) 97.4 F (36.3 C) (Oral)   Resp 16   SpO2 95%   Physical Exam Vitals and nursing note reviewed.  Constitutional:      General: He is not in acute distress. HENT:     Mouth/Throat:     Pharynx: Oropharynx is clear.  Cardiovascular:     Rate and Rhythm: Normal rate and regular rhythm.     Pulses: Normal pulses.     Heart sounds: Normal heart sounds.  Pulmonary:     Effort: Pulmonary effort is normal.     Breath sounds: Normal breath sounds.  Musculoskeletal:        General: Normal range of motion.     Right elbow: Normal. No swelling or deformity. Normal range of motion. No tenderness.     Left elbow: Normal.     Comments: Normal upper extremity exam without swelling, deformity,  tenderness. Full ROM. Strength and sensation intact. Strong radial pulses  Skin:    General: Skin is warm and dry.     Capillary Refill: Capillary refill takes less than 2 seconds.  Neurological:     Mental Status: He is alert and oriented to person, place, and time.     UC Treatments / Results  Labs (all labs ordered are listed, but only abnormal results are displayed) Labs Reviewed  CYTOLOGY, (ORAL, ANAL, URETHRAL) ANCILLARY ONLY    EKG  Radiology No results found.  Procedures Procedures   Medications Ordered in UC Medications  metroNIDAZOLE  (FLAGYL ) tablet 2,000 mg (2,000 mg Oral Given 03/29/24 1741)    Initial Impression / Assessment and Plan / UC Course  I have reviewed the triage vital signs and the nursing notes.  Pertinent labs & imaging results that were available during my care of the patient were reviewed by me and considered in my medical decision making (see chart for details).  Unremarkable elbow exam No indication for xray imaging at this time ACE wrap applied in clinic. RICE therapy, pain control discussed. Follow with ortho if needed.  STD exposure Cytology swab pending. Treated for trichomonas with 2g flagyl  in clinic. Advised safe sex precautions Patient agrees to plan, no questions  Advised to contact primary care to make appointment for follow up regarding blood pressure. Discussion with patient about taking his medication consistently daily.   Final Clinical Impressions(s) / UC Diagnoses   Final diagnoses:  Right elbow pain  Trichomonas exposure  Screen for STD (sexually transmitted disease)     Discharge Instructions      Rest - try to avoid heavy lifting and high impact activity Ice - apply for 20 minutes a few times daily Compression - use ace wrap for support Elevation - prop up on a pillow  You can take ibuprofen  every 6 hours as needed for pain Follow up with orthopedics if pain persists   We will call you if anything on your  swab returns positive. You can also see these results on MyChart. Please abstain from sexual intercourse until your results return. I have treated you for trichomonas today with Flagyl .      ED Prescriptions   None    PDMP not reviewed this  encounter.   Creighton Doffing, [place] 03/29/24 4098

## 2024-03-29 NOTE — ED Triage Notes (Signed)
 Here for right elbow pain x 2 weeks. Patient states he feels fluid in his elbow. Patient has been expose to [place].

## 2024-03-31 LAB — CYTOLOGY, (ORAL, ANAL, URETHRAL) ANCILLARY ONLY
Chlamydia: NEGATIVE
Comment: NEGATIVE
Comment: NEGATIVE
Comment: NORMAL
Neisseria Gonorrhea: NEGATIVE
Trichomonas: NEGATIVE

## 2024-04-21 DIAGNOSIS — Z419 Encounter for procedure for purposes other than remedying health state, unspecified: Secondary | ICD-10-CM | POA: Diagnosis not present

## 2024-05-22 DIAGNOSIS — Z419 Encounter for procedure for purposes other than remedying health state, unspecified: Secondary | ICD-10-CM | POA: Diagnosis not present

## 2024-06-21 DIAGNOSIS — Z419 Encounter for procedure for purposes other than remedying health state, unspecified: Secondary | ICD-10-CM | POA: Diagnosis not present

## 2024-06-30 ENCOUNTER — Ambulatory Visit: Payer: Self-pay

## 2024-06-30 NOTE — Telephone Encounter (Signed)
 FYI Only or Action Required?: FYI only for provider.  Patient was last seen in primary care on 03/02/2023 by Lorren Greig PARAS, NP.  Called Nurse Triage reporting Hypertension.  Symptoms began a week ago.  Interventions attempted: Nothing.  Symptoms are: unchanged.  Triage Disposition: No disposition on file.  Patient/caregiver understands and will follow disposition?:    Copied from CRM [id number]. Topic: Clinical - Red Word Triage >> Jun 30, 2024 11:27 AM Donna BRAVO wrote: Red Word that prompted transfer to Nurse Triage: patient has headaches more frequent, every morning when sitting patient is nauseated  both legs go to sleep, and numb when standing, lasting 15 20 min  ----------------------------------------------------------------------- From previous Reason for Contact - Scheduling: Patient/patient representative is calling to schedule an appointment. Refer to attachments for appointment information.  Patient calling to schedule appt for  BP medication check Reason for Disposition  [1] Systolic BP >= 160 OR Diastolic >= 100 AND [2] cardiac (e.g., breathing difficulty, chest pain) or neurologic symptoms (e.g., new-onset blurred or double vision, unsteady gait)  Answer Assessment - Initial Assessment Questions 1. BLOOD PRESSURE: What is your blood pressure? Did you take at least two measurements 5 minutes apart?     240/169 last checked a week ago 2. ONSET: When did you take your blood pressure?     Week ago 3. HOW: How did you take your blood pressure? (e.g., automatic home BP monitor, visiting nurse)     automatic 4. HISTORY: Do you have a history of high blood pressure?     yes 5. MEDICINES: Are you taking any medicines for blood pressure? Have you missed any doses recently?     no 6. OTHER SYMPTOMS: Do you have any symptoms? (e.g., blurred vision, chest pain, difficulty breathing, headache, weakness)     Nausea, bilateral numbness & tingling, lightheadedness &  dizziness 7. PREGNANCY: Is there any chance you are pregnant? When was your last menstrual period?     Na  Pt stated last time he saw PCP they were going to up his BP medication but he never received a new Rx.  Referred pt to ED  Protocols used: Blood Pressure - High-A-AH

## 2024-07-22 DIAGNOSIS — Z419 Encounter for procedure for purposes other than remedying health state, unspecified: Secondary | ICD-10-CM | POA: Diagnosis not present

## 2024-08-22 DIAGNOSIS — Z419 Encounter for procedure for purposes other than remedying health state, unspecified: Secondary | ICD-10-CM | POA: Diagnosis not present

## 2024-09-21 DIAGNOSIS — Z419 Encounter for procedure for purposes other than remedying health state, unspecified: Secondary | ICD-10-CM | POA: Diagnosis not present

## 2024-09-29 ENCOUNTER — Emergency Department (HOSPITAL_BASED_OUTPATIENT_CLINIC_OR_DEPARTMENT_OTHER)
Admission: EM | Admit: 2024-09-29 | Discharge: 2024-09-29 | Disposition: A | Discharge status: Home / Self Care | Attending: Emergency Medicine | Admitting: Emergency Medicine

## 2024-09-29 ENCOUNTER — Other Ambulatory Visit: Payer: Self-pay

## 2024-09-29 ENCOUNTER — Emergency Department (HOSPITAL_BASED_OUTPATIENT_CLINIC_OR_DEPARTMENT_OTHER): Admitting: Radiology

## 2024-09-29 DIAGNOSIS — I10 Essential (primary) hypertension: Secondary | ICD-10-CM | POA: Diagnosis not present

## 2024-09-29 DIAGNOSIS — Z79899 Other long term (current) drug therapy: Secondary | ICD-10-CM | POA: Insufficient documentation

## 2024-09-29 DIAGNOSIS — F17200 Nicotine dependence, unspecified, uncomplicated: Secondary | ICD-10-CM | POA: Diagnosis not present

## 2024-09-29 DIAGNOSIS — R079 Chest pain, unspecified: Secondary | ICD-10-CM | POA: Diagnosis not present

## 2024-09-29 DIAGNOSIS — R0789 Other chest pain: Secondary | ICD-10-CM | POA: Insufficient documentation

## 2024-09-29 LAB — BASIC METABOLIC PANEL WITH GFR
Anion gap: 13 (ref 5–15)
BUN: 11 mg/dL (ref 6–20)
CO2: 23 mmol/L (ref 22–32)
Calcium: 9.9 mg/dL (ref 8.9–10.3)
Chloride: 103 mmol/L (ref 98–111)
Creatinine, Ser: 0.98 mg/dL (ref 0.61–1.24)
GFR, Estimated: >60 mL/min (ref >60)
Glucose, Bld: 108 mg/dL — ABNORMAL HIGH (ref 70–99)
Potassium: 4 mmol/L (ref 3.5–5.1)
Sodium: 139 mmol/L (ref 135–145)

## 2024-09-29 LAB — CBC
HCT: 46.6 % (ref 39.0–52.0)
Hemoglobin: 15.9 g/dL (ref 13.0–17.0)
MCH: 29.3 pg (ref 26.0–34.0)
MCHC: 34.1 g/dL (ref 30.0–36.0)
MCV: 86 fL (ref 80.0–100.0)
Platelets: 290 K/uL (ref 150–400)
RBC: 5.42 MIL/uL (ref 4.22–5.81)
RDW: 15 % (ref 11.5–15.5)
WBC: 6.7 K/uL (ref 4.0–10.5)
nRBC: 0 % (ref 0.0–0.2)

## 2024-09-29 LAB — TROPONIN T, HIGH SENSITIVITY: Troponin T High Sensitivity: <15 ng/L (ref 0–19)

## 2024-09-29 MED ORDER — METHOCARBAMOL 500 MG PO TABS: 1000.0000 mg | ORAL_TABLET | Freq: Once | ORAL | Status: DC

## 2024-09-29 MED ORDER — KETOROLAC TROMETHAMINE 15 MG/ML IJ SOLN: 15.0000 mg | Freq: Once | INTRAMUSCULAR | Status: DC

## 2024-09-29 NOTE — ED Provider Notes (Signed)
  Physical Exam  BP (!) 155/96   Pulse 62   Temp 98.3 F (36.8 C) (Oral)   Resp (!) 21   SpO2 95%   Physical Exam  Procedures  Procedures  ED Course / MDM     Patient was received at shift change from PA-C Barrett.  Plan is to wait for repeat troponin and reassess for discharge.  For full HPI please refer to previous providers note.  In short 38 year old male with no significant past medical history reports with 1 day of chest pain radiating around the entire aspect of his chest.  Pain is exacerbated with movement.  Upon waiting for repeat troponin.  Patient was not in the room.  Nurse advised that he had eloped.  Repeat assessment was not able to be done due to patient eloping.      Myriam Fonda RAMAN, [place] 09/30/24 0001    Cottie Donnice PARAS, MD 09/30/24 1430

## 2024-09-29 NOTE — ED Provider Notes (Signed)
 [facility] AT [facility] Provider Note   CSN: [id number] Arrival date & time: 09/29/24  1552     Patient presents with: Chest Pain   Alexander Rose is a 38 y.o. male patient reporting to emergency room with noncontributory past medical history reporting to emergency room with approximately 1 day of chest pain.  Patient reports that his chest pain radiates around the entire aspect of his chest and it is constant.  He reports that it is worse with movement like specifically sitting up or moving side-to-side.  He does have mild reproducible tenderness to the touch.  He does admit to smoking.  He denies any shortness of breath, swelling in lower extremities cough or fever.  He is never had anything like this before.  {Add pertinent medical, surgical, social history, OB history to HPI:32947}  Chest Pain      Prior to Admission medications   Medication Sig Start Date End Date Taking? Authorizing Provider  albuterol  (VENTOLIN  HFA) 108 (90 Base) MCG/ACT inhaler Inhale 2 puffs into the lungs every 4 (four) hours as needed for wheezing or shortness of breath. 03/02/23   Lorren Greig PARAS, NP  amLODipine  (NORVASC ) 5 MG tablet Take 1 tablet (5 mg total) by mouth daily. 07/09/23   Lorren Greig PARAS, NP  hydrOXYzine  (ATARAX ) 25 MG tablet Take 1 tablet (25 mg total) by mouth every 6 (six) hours as needed for anxiety. 03/02/23   Lorren Greig PARAS, NP  Spacer/Aero-Holding Raguel (AEROCHAMBER MV) inhaler Use as instructed 12/15/22   Van Knee, MD  sertraline  (ZOLOFT ) 50 MG tablet Take 1 tablet (50 mg total) by mouth daily. 06/06/20 02/27/21  Christopher Savannah, PA-C    Allergies: Patient has no known allergies.    Review of Systems  Cardiovascular:  Positive for chest pain.    Updated Vital Signs BP (!) 151/110   Pulse (!) 55   Temp 98.3 F (36.8 C) (Oral)   Resp (!) 21   SpO2 97%   Physical Exam Vitals and nursing note reviewed.  Constitutional:      General: He is  not in acute distress.    Appearance: He is not toxic-appearing.  HENT:     Head: Normocephalic and atraumatic.  Eyes:     General: No scleral icterus.    Conjunctiva/sclera: Conjunctivae normal.  Cardiovascular:     Rate and Rhythm: Normal rate and regular rhythm.     Pulses: Normal pulses.     Heart sounds: Normal heart sounds.  Pulmonary:     Effort: Pulmonary effort is normal. No respiratory distress.     Breath sounds: Normal breath sounds.     Comments: Pain worse when sitting up.  Mild reproducible on chest exam. Abdominal:     General: Abdomen is flat. Bowel sounds are normal.     Palpations: Abdomen is soft.     Tenderness: There is no abdominal tenderness.  Skin:    General: Skin is warm and dry.     Findings: No lesion.  Neurological:     General: No focal deficit present.     Mental Status: He is alert and oriented to person, place, and time. Mental status is at baseline.     (all labs ordered are listed, but only abnormal results are displayed) Labs Reviewed  BASIC METABOLIC PANEL WITH GFR - Abnormal; Notable for the following components:      Result Value   Glucose, Bld 108 (*)    All other components within normal  limits  CBC  TROPONIN T, HIGH SENSITIVITY  TROPONIN T, HIGH SENSITIVITY    EKG: EKG Interpretation Date/Time:  Monday September 29 2024 16:01:45 EDT Ventricular Rate:  81 PR Interval:  148 QRS Duration:  92 QT Interval:  366 QTC Calculation: 425 R Axis:   -48  Text Interpretation: Normal sinus rhythm with sinus arrhythmia Left anterior fascicular block Nonspecific ST abnormality When compared with ECG of 17-Apr-2022 10:33, No significant change was found No sig change from prior tracing, ST inverion lead III seen in 2023 Confirmed by Cottie Cough ([id number]) on 09/29/2024 6:47:17 PM  Radiology: [facility] Chest 2 View Result Date: 09/29/2024 CLINICAL DATA:  Upper chest pain since yesterday EXAM: CHEST - 2 VIEW COMPARISON:  04/17/2022 FINDINGS: The  heart size and mediastinal contours are within normal limits. Both lungs are clear. The visualized skeletal structures are unremarkable. IMPRESSION: No active cardiopulmonary disease. Electronically Signed   By: Ozell Daring M.D.   On: 09/29/2024 17:41    {Document cardiac monitor, telemetry assessment procedure when appropriate:32947} Procedures   Medications Ordered in the ED  methocarbamol (ROBAXIN) tablet 1,000 mg (has no administration in time range)  ketorolac  (TORADOL ) 15 MG/ML injection 15 mg (has no administration in time range)      {Click here for ABCD2, HEART and other calculators REFRESH Note before signing:1}                              Medical Decision Making Amount and/or Complexity of Data Reviewed Labs: ordered. Radiology: ordered.  Risk Prescription drug management.   This patient presents to the ED for concern of chest pain, this involves an extensive number of treatment options, and is a complaint that carries with it a high risk of complications and morbidity.  The differential diagnosis includes ACS, stable angina, CHF, pneumonia, pneumothorax, aortic dissection, pulmonary embolus    Co morbidities that complicate the patient evaluation  ***   Additional history obtained:  Additional history obtained from ***   Lab Tests:  I personally interpreted labs.  The pertinent results include:  *** Troponin ***  Patient is PERC negative thus doubt DVT   Imaging Studies ordered:  I ordered imaging studies including chest x-ray I independently visualized and interpreted imaging which showed *** I agree with the radiologist interpretation   Cardiac Monitoring: / EKG:  The patient was maintained on a cardiac monitor.  I personally viewed and interpreted the cardiac monitored which showed an underlying rhythm of: This with nonspecific ST changes    Problem List / ED Course / Critical interventions / Medication management  *** I ordered medication  including ***  for ***  Reevaluation of the patient after these medicines showed that the patient {resolved/improved/worsened:23923::improved} I have reviewed the patients home medicines and have made adjustments as needed. Patient signed out to oncoming PA at shift change pending repeat troponin as well as reevaluation.   {Document critical care time when appropriate  Document review of labs and clinical decision tools ie CHADS2VASC2, etc  Document your independent review of radiology images and any outside records  Document your discussion with family members, caretakers and with consultants  Document social determinants of health affecting pt's care  Document your decision making why or why not admission, treatments were needed:32947:::1}   Final diagnoses:  None    ED Discharge Orders     None

## 2024-09-29 NOTE — ED Notes (Addendum)
 Pt not in room at this time

## 2024-09-29 NOTE — ED Triage Notes (Signed)
 CP across upper chest starting last night. NSAIDs at home no relief. Worse with movement. Made better by laying down. Denies trauma. Smokes, sees PCP yearly. Daily meds for HTN.
